# Patient Record
Sex: Female | Born: 1955
Health system: Southern US, Community
[De-identification: ages and names within clinical notes are randomized; demographics above are authoritative.]

## PROBLEM LIST (undated history)

## (undated) DIAGNOSIS — M5090 Cervical disc disorder, unspecified, unspecified cervical region: Secondary | ICD-10-CM

## (undated) DIAGNOSIS — I700 Atherosclerosis of aorta: Secondary | ICD-10-CM

## (undated) DIAGNOSIS — E669 Obesity, unspecified: Secondary | ICD-10-CM

## (undated) DIAGNOSIS — M6289 Other specified disorders of muscle: Secondary | ICD-10-CM

## (undated) DIAGNOSIS — M5136 Other intervertebral disc degeneration, lumbar region: Secondary | ICD-10-CM

## (undated) DIAGNOSIS — F419 Anxiety disorder, unspecified: Secondary | ICD-10-CM

## (undated) DIAGNOSIS — T7840XA Allergy, unspecified, initial encounter: Secondary | ICD-10-CM

## (undated) DIAGNOSIS — I2510 Atherosclerotic heart disease of native coronary artery without angina pectoris: Secondary | ICD-10-CM

## (undated) DIAGNOSIS — E785 Hyperlipidemia, unspecified: Secondary | ICD-10-CM

## (undated) DIAGNOSIS — E66811 Obesity, class 1: Secondary | ICD-10-CM

## (undated) DIAGNOSIS — K581 Irritable bowel syndrome with constipation: Secondary | ICD-10-CM

## (undated) DIAGNOSIS — T884XXA Failed or difficult intubation, initial encounter: Secondary | ICD-10-CM

## (undated) DIAGNOSIS — F32A Depression, unspecified: Secondary | ICD-10-CM

## (undated) DIAGNOSIS — Z8489 Family history of other specified conditions: Secondary | ICD-10-CM

## (undated) DIAGNOSIS — J329 Chronic sinusitis, unspecified: Secondary | ICD-10-CM

## (undated) DIAGNOSIS — M542 Cervicalgia: Secondary | ICD-10-CM

## (undated) DIAGNOSIS — M1990 Unspecified osteoarthritis, unspecified site: Secondary | ICD-10-CM

## (undated) DIAGNOSIS — Z720 Tobacco use: Secondary | ICD-10-CM

## (undated) DIAGNOSIS — F329 Major depressive disorder, single episode, unspecified: Secondary | ICD-10-CM

## (undated) DIAGNOSIS — C801 Malignant (primary) neoplasm, unspecified: Secondary | ICD-10-CM

## (undated) DIAGNOSIS — R7301 Impaired fasting glucose: Secondary | ICD-10-CM

## (undated) DIAGNOSIS — M51369 Other intervertebral disc degeneration, lumbar region without mention of lumbar back pain or lower extremity pain: Secondary | ICD-10-CM

## (undated) HISTORY — PX: OTHER SURGICAL HISTORY: SHX169

## (undated) HISTORY — DX: Other intervertebral disc degeneration, lumbar region: M51.36

## (undated) HISTORY — PX: BREAST CYST ASPIRATION: SHX578

## (undated) HISTORY — DX: Obesity, unspecified: E66.9

## (undated) HISTORY — DX: Depression, unspecified: F32.A

## (undated) HISTORY — PX: BLADDER SURGERY: SHX569

## (undated) HISTORY — DX: Tobacco use: Z72.0

## (undated) HISTORY — DX: Impaired fasting glucose: R73.01

## (undated) HISTORY — DX: Major depressive disorder, single episode, unspecified: F32.9

## (undated) HISTORY — DX: Allergy, unspecified, initial encounter: T78.40XA

## (undated) HISTORY — PX: TUBAL LIGATION: SHX77

## (undated) HISTORY — DX: Unspecified osteoarthritis, unspecified site: M19.90

## (undated) HISTORY — PX: APPENDECTOMY: SHX54

## (undated) HISTORY — PX: TONSILLECTOMY: SHX5217

## (undated) HISTORY — DX: Obesity, class 1: E66.811

## (undated) HISTORY — DX: Other intervertebral disc degeneration, lumbar region without mention of lumbar back pain or lower extremity pain: M51.369

## (undated) HISTORY — DX: Irritable bowel syndrome with constipation: K58.1

## (undated) HISTORY — DX: Anxiety disorder, unspecified: F41.9

## (undated) HISTORY — PX: HEMORRHOID SURGERY: SHX153

## (undated) HISTORY — PX: TONSILLECTOMY: SUR1361

## (undated) HISTORY — DX: Hyperlipidemia, unspecified: E78.5

## (undated) HISTORY — DX: Other specified disorders of muscle: M62.89

---

## 2005-05-20 ENCOUNTER — Ambulatory Visit: Payer: Self-pay | Admitting: Gastroenterology

## 2005-05-31 ENCOUNTER — Ambulatory Visit: Payer: Self-pay | Admitting: Gastroenterology

## 2005-06-25 ENCOUNTER — Ambulatory Visit: Payer: Self-pay | Admitting: Gastroenterology

## 2005-06-28 ENCOUNTER — Ambulatory Visit: Payer: Self-pay | Admitting: Gastroenterology

## 2006-11-14 ENCOUNTER — Ambulatory Visit: Payer: Self-pay | Admitting: Physician Assistant

## 2007-07-29 ENCOUNTER — Ambulatory Visit: Payer: Self-pay | Admitting: Obstetrics and Gynecology

## 2008-04-04 ENCOUNTER — Ambulatory Visit: Payer: Self-pay | Admitting: Pain Medicine

## 2008-04-07 ENCOUNTER — Ambulatory Visit: Payer: Self-pay | Admitting: Pain Medicine

## 2008-04-27 ENCOUNTER — Ambulatory Visit: Payer: Self-pay | Admitting: Physician Assistant

## 2008-05-05 ENCOUNTER — Ambulatory Visit: Payer: Self-pay | Admitting: Pain Medicine

## 2008-05-19 ENCOUNTER — Ambulatory Visit: Payer: Self-pay | Admitting: Pain Medicine

## 2008-06-07 ENCOUNTER — Ambulatory Visit: Payer: Self-pay | Admitting: Physician Assistant

## 2008-09-21 ENCOUNTER — Ambulatory Visit: Payer: Self-pay | Admitting: Obstetrics and Gynecology

## 2008-12-01 ENCOUNTER — Ambulatory Visit: Payer: Self-pay

## 2008-12-01 ENCOUNTER — Ambulatory Visit: Payer: Self-pay | Admitting: Pain Medicine

## 2009-01-03 ENCOUNTER — Ambulatory Visit: Payer: Self-pay | Admitting: Pain Medicine

## 2009-02-09 ENCOUNTER — Ambulatory Visit: Payer: Self-pay | Admitting: Pain Medicine

## 2009-10-05 ENCOUNTER — Ambulatory Visit: Payer: Self-pay | Admitting: Family Medicine

## 2011-01-15 ENCOUNTER — Emergency Department: Payer: Self-pay | Admitting: Emergency Medicine

## 2011-02-12 ENCOUNTER — Ambulatory Visit: Payer: Self-pay | Admitting: Family Medicine

## 2011-04-01 ENCOUNTER — Ambulatory Visit: Payer: Self-pay | Admitting: Surgery

## 2011-04-02 LAB — PATHOLOGY REPORT

## 2012-04-08 ENCOUNTER — Ambulatory Visit: Payer: Self-pay | Admitting: Family Medicine

## 2012-09-18 ENCOUNTER — Ambulatory Visit: Payer: Self-pay | Admitting: Unknown Physician Specialty

## 2012-09-21 LAB — PATHOLOGY REPORT

## 2012-11-10 ENCOUNTER — Ambulatory Visit: Payer: Self-pay | Admitting: Family Medicine

## 2012-12-05 ENCOUNTER — Ambulatory Visit: Payer: Self-pay | Admitting: Orthopedic Surgery

## 2013-05-27 ENCOUNTER — Other Ambulatory Visit: Payer: Self-pay | Admitting: Neurosurgery

## 2013-05-27 DIAGNOSIS — M5416 Radiculopathy, lumbar region: Secondary | ICD-10-CM

## 2013-05-27 DIAGNOSIS — M47812 Spondylosis without myelopathy or radiculopathy, cervical region: Secondary | ICD-10-CM

## 2013-06-28 ENCOUNTER — Ambulatory Visit
Admission: RE | Admit: 2013-06-28 | Discharge: 2013-06-28 | Disposition: A | Discharge status: Home / Self Care | Payer: Self-pay | Source: Ambulatory Visit | Attending: Neurosurgery | Admitting: Neurosurgery

## 2013-06-28 VITALS — BP 113/63 | HR 47 | Ht 63.0 in | Wt 173.0 lb

## 2013-06-28 DIAGNOSIS — M5416 Radiculopathy, lumbar region: Secondary | ICD-10-CM

## 2013-06-28 DIAGNOSIS — M47812 Spondylosis without myelopathy or radiculopathy, cervical region: Secondary | ICD-10-CM

## 2013-06-28 MED ORDER — IOHEXOL 300 MG/ML  SOLN
10.0000 mL | Freq: Once | INTRAMUSCULAR | Status: AC | PRN
  Administered 2013-06-28: 10 mL via INTRATHECAL

## 2013-06-28 MED ORDER — DIAZEPAM 5 MG PO TABS
10.0000 mg | ORAL_TABLET | Freq: Once | ORAL | Status: AC
  Administered 2013-06-28: 10 mg via ORAL

## 2013-06-29 ENCOUNTER — Ambulatory Visit: Payer: Self-pay | Admitting: Family Medicine

## 2014-07-14 DIAGNOSIS — E785 Hyperlipidemia, unspecified: Secondary | ICD-10-CM | POA: Insufficient documentation

## 2014-08-18 ENCOUNTER — Ambulatory Visit: Payer: Self-pay | Admitting: Obstetrics and Gynecology

## 2015-08-23 ENCOUNTER — Telehealth: Payer: Self-pay

## 2015-08-23 NOTE — Telephone Encounter (Signed)
Per the request of Dr. Bobetta Lime, I contacted this patient to inform her that the high dosage Flu vaccine was now available here in the clinic so her parents could come at anytime to get an injection. I also discussed her dad's Roseanne Reno) HgA1c results with her and how Dr. Nadine Counts wants her to discontinue the Glipizide 2.5mg  XL (at risk for low sugars) and to closely monitor his blood sugar at home. If it is >150 then he is to take 1 pill but otherwise stop that medication. She was told that a G.I referral was placed with [facility] in regards to his swallowing issues. I also got confirmation that the RX printed was the only ones needed.   She said ok.

## 2015-09-08 ENCOUNTER — Emergency Department: Payer: Managed Care, Other (non HMO)

## 2015-09-08 ENCOUNTER — Encounter: Payer: Self-pay | Admitting: *Deleted

## 2015-09-08 ENCOUNTER — Emergency Department
Admission: EM | Admit: 2015-09-08 | Discharge: 2015-09-09 | Disposition: A | Discharge status: Home / Self Care | Payer: Managed Care, Other (non HMO) | Attending: Emergency Medicine | Admitting: Emergency Medicine

## 2015-09-08 DIAGNOSIS — Z791 Long term (current) use of non-steroidal anti-inflammatories (NSAID): Secondary | ICD-10-CM | POA: Diagnosis not present

## 2015-09-08 DIAGNOSIS — Z720 Tobacco use: Secondary | ICD-10-CM | POA: Insufficient documentation

## 2015-09-08 DIAGNOSIS — M25562 Pain in left knee: Secondary | ICD-10-CM | POA: Diagnosis not present

## 2015-09-08 NOTE — ED Provider Notes (Signed)
[facility] Emergency Department Provider Note  ____________________________________________  Time seen: 2340  I have reviewed the triage vital signs and the nursing notes.   HISTORY  Chief Complaint Leg Pain  posterior left leg     HPI Megan Nichols is a 59 y.o. female who developed discomfort behind her left knee yesterday. She reports she lives in a "trilevel house" and she needs to walk up and down stairs firm amount. It is uncomfortable when she goes up the stairs. She denies any recent trauma, although she does report a fall and injury to the left knee back in July. She had no difficulty since then.  The patient's sister was concerned that the discomfort may be reflective of a deep vein thrombosis. The patient has presented to the hospital for evaluation of the knee. She denies any prior history of DVT. She has had no recent immobilization or other risks for a DVT.     History reviewed. No pertinent past medical history.  There are no active problems to display for this patient.   Past Surgical History  Procedure Laterality Date  . Bladder surgery    . Appendectomy    . Hemorrhoid surgery      Current Outpatient Rx  Name  Route  Sig  Dispense  Refill  . etodolac (LODINE) 200 MG capsule   Oral   Take 200 mg by mouth once.           Allergies Review of patient's allergies indicates no known allergies.  No family history on file.  Social History Social History  Substance Use Topics  . Smoking status: Current Every Day Smoker -- 1.00 packs/day for 30 years    Types: Cigarettes  . Smokeless tobacco: Never Used  . Alcohol Use: No    Review of Systems  Constitutional: Negative for fever. ENT: Negative for sore throat. Cardiovascular: Negative for chest pain. Respiratory: Negative for shortness of breath. Gastrointestinal: Negative for abdominal pain, vomiting and diarrhea. Genitourinary: Negative for  dysuria. Musculoskeletal: Pain, left knee. See history of present illness Skin: Negative for rash. Neurological: Negative for headaches   10-point ROS otherwise negative.  ____________________________________________   PHYSICAL EXAM:  VITAL SIGNS: ED Triage Vitals  Enc Vitals Group     BP 09/08/15 2054 118/77 mmHg     Pulse Rate 09/08/15 2054 73     Resp 09/08/15 2054 16     Temp 09/08/15 2054 98.3 F (36.8 C)     Temp Source 09/08/15 2054 Oral     SpO2 09/08/15 2054 94 %     Weight 09/08/15 2054 180 lb (81.647 kg)     Height 09/08/15 2054 5\' 4"  (1.626 m)     Head Cir --      Peak Flow --      Pain Score 09/08/15 2056 5     Pain Loc --      Pain Edu? --      Excl. in [place]? --     Constitutional:  Alert and oriented. Well appearing and in no distress. ENT   Head: Normocephalic and atraumatic. Cardiovascular: Normal rate, regular rhythm, no murmur noted Respiratory:  Normal respiratory effort, no tachypnea.    Breath sounds are clear and equal bilaterally.  Gastrointestinal: Soft and nontender. No distention.  Musculoskeletal: No deformity noted. Minimal tenderness behind the left knee with normal range of motion in all extremities.  No noted edema. Calves are equal in size bilaterally. Neurologic:  Normal speech and  language. No gross focal neurologic deficits are appreciated.  Skin:  Skin is warm, dry. No rash noted. No erythema or signs of cellulitis. Psychiatric: Mood and affect are normal. Speech and behavior are normal.  ____________________________________________   RADIOLOGY   IMPRESSION: No evidence of LEFT lower extremity deep venous thrombosis.  ____________________________________________   INITIAL IMPRESSION / ASSESSMENT AND PLAN / ED COURSE  Pertinent labs & imaging results that were available during my care of the patient were reviewed by me and considered in my medical decision making (see chart for details).  Pleasant 59 year old female with  concerns for a possible DVT behind her left knee. She has no noted risk factors. The ultrasound was negative for a DVT. I believe this discomfort is more likely musculoskeletal. We've discussed the possibility of a Baker's cyst. I've asked her to follow-up with her regular doctor if it does not improve.   ____________________________________________   FINAL CLINICAL IMPRESSION(S) / ED DIAGNOSES  Final diagnoses:  Pain in left knee      Ahmed Prima, MD 09/08/15 2356

## 2015-09-08 NOTE — ED Notes (Addendum)
Pt reports pain in the back of her left knee that extends down her left leg since yesterday, denies swelling, sob or cp. Denies injury

## 2015-09-08 NOTE — Discharge Instructions (Signed)
The Doppler ultrasound of your left leg was negative for a blood clot. This discomfort may be musculoskeletal. It may be a Baker's cyst. Continue light activity as tolerated. Follow-up with your doctor or with orthopedics if it does not improve. Return to the emergency department if you have swelling, redness and warmth, or other urgent concerns.

## 2015-10-07 ENCOUNTER — Other Ambulatory Visit: Payer: Self-pay | Admitting: Family Medicine

## 2015-11-17 ENCOUNTER — Encounter: Payer: Self-pay | Admitting: Family Medicine

## 2015-11-17 ENCOUNTER — Ambulatory Visit (INDEPENDENT_AMBULATORY_CARE_PROVIDER_SITE_OTHER): Payer: Managed Care, Other (non HMO) | Admitting: Family Medicine

## 2015-11-17 VITALS — BP 108/66 | HR 98 | Temp 98.5°F | Resp 16 | Wt 189.0 lb

## 2015-11-17 DIAGNOSIS — R350 Frequency of micturition: Secondary | ICD-10-CM | POA: Diagnosis not present

## 2015-11-17 DIAGNOSIS — M5090 Cervical disc disorder, unspecified, unspecified cervical region: Secondary | ICD-10-CM | POA: Insufficient documentation

## 2015-11-17 DIAGNOSIS — Z23 Encounter for immunization: Secondary | ICD-10-CM | POA: Diagnosis not present

## 2015-11-17 DIAGNOSIS — M5136 Other intervertebral disc degeneration, lumbar region: Secondary | ICD-10-CM | POA: Insufficient documentation

## 2015-11-17 DIAGNOSIS — R5383 Other fatigue: Secondary | ICD-10-CM | POA: Diagnosis not present

## 2015-11-17 DIAGNOSIS — J209 Acute bronchitis, unspecified: Secondary | ICD-10-CM | POA: Insufficient documentation

## 2015-11-17 DIAGNOSIS — Z0000 Encounter for general adult medical examination without abnormal findings: Secondary | ICD-10-CM | POA: Insufficient documentation

## 2015-11-17 DIAGNOSIS — F32A Depression, unspecified: Secondary | ICD-10-CM | POA: Insufficient documentation

## 2015-11-17 DIAGNOSIS — Z1231 Encounter for screening mammogram for malignant neoplasm of breast: Secondary | ICD-10-CM | POA: Insufficient documentation

## 2015-11-17 DIAGNOSIS — L989 Disorder of the skin and subcutaneous tissue, unspecified: Secondary | ICD-10-CM | POA: Insufficient documentation

## 2015-11-17 DIAGNOSIS — F329 Major depressive disorder, single episode, unspecified: Secondary | ICD-10-CM | POA: Insufficient documentation

## 2015-11-17 DIAGNOSIS — M1990 Unspecified osteoarthritis, unspecified site: Secondary | ICD-10-CM | POA: Insufficient documentation

## 2015-11-17 DIAGNOSIS — K581 Irritable bowel syndrome with constipation: Secondary | ICD-10-CM | POA: Insufficient documentation

## 2015-11-17 DIAGNOSIS — E785 Hyperlipidemia, unspecified: Secondary | ICD-10-CM

## 2015-11-17 DIAGNOSIS — Z789 Other specified health status: Secondary | ICD-10-CM | POA: Insufficient documentation

## 2015-11-17 DIAGNOSIS — E669 Obesity, unspecified: Secondary | ICD-10-CM | POA: Insufficient documentation

## 2015-11-17 DIAGNOSIS — N8184 Pelvic muscle wasting: Secondary | ICD-10-CM | POA: Insufficient documentation

## 2015-11-17 DIAGNOSIS — Z1389 Encounter for screening for other disorder: Secondary | ICD-10-CM | POA: Insufficient documentation

## 2015-11-17 DIAGNOSIS — F419 Anxiety disorder, unspecified: Secondary | ICD-10-CM

## 2015-11-17 LAB — POCT URINALYSIS DIPSTICK
Bilirubin, UA: NEGATIVE
Blood, UA: NEGATIVE
GLUCOSE UA: NEGATIVE
Ketones, UA: NEGATIVE
NITRITE UA: NEGATIVE
PROTEIN UA: NEGATIVE
SPEC GRAV UA: 1.015
UROBILINOGEN UA: 0.2
pH, UA: 6

## 2015-11-17 NOTE — Progress Notes (Signed)
Name: Megan Nichols   MRN: [id number]    DOB: July 02, 1956   Date:11/17/2015       Progress Note  Subjective  Chief Complaint  Chief Complaint  Patient presents with  . Annual Exam  . Referral    mammogram  . Urinary Tract Infection    HPI  Patient is here today for a Complete Female Physical Exam:  The patient has complaints of urinary frequency without dysuria. Overall feels healthy. Diet is well balanced. In general does not exercise regularly. Sees dentist regularly and addresses vision concerns with ophthalmologist if applicable. In regards to sexual activity the patient is not currently sexually active. Currently is not concerned about exposure to any STDs.   Requested Pnuemococcal 13 valent immunization due to previous hx of PNA and continues to smoke cigarettes.   Menstrual history is significant for postmenopausal.  Past Medical History  Diagnosis Date  . Allergy   . Arthritis   . Hyperlipidemia   . Obesity, Class I, BMI 30.0-34.9 (see actual BMI)   . Depression   . Anxiety   . Irritable bowel syndrome with constipation   . Degeneration of intervertebral disc of lumbar region   . Pelvic floor dysfunction     Past Surgical History  Procedure Laterality Date  . Bladder surgery    . Appendectomy    . Hemorrhoid surgery    . Tonsillectomy      Family History  Problem Relation Age of Onset  . Diabetes Father   . Heart disease Father     Social History   Social History  . Marital Status: Single    Spouse Name: N/A  . Number of Children: N/A  . Years of Education: N/A   Occupational History  . Not on file.   Social History Main Topics  . Smoking status: Current Every Day Smoker -- 1.00 packs/day for 30 years    Types: Cigarettes  . Smokeless tobacco: Never Used  . Alcohol Use: 0.0 oz/week    0 Standard drinks or equivalent per week     Comment: very rare  . Drug Use: No  . Sexual Activity: No   Other Topics Concern  . Not on file    Social History Narrative     Current outpatient prescriptions:  .  carisoprodol (SOMA) 350 MG tablet, TAKE 1 TABLET BY MOUTH TWICE A DAY AS NEEDED, Disp: 60 tablet, Rfl: 1 .  fluticasone (FLONASE) 50 MCG/ACT nasal spray, Place 2 sprays into both nostrils daily., Disp: , Rfl: 1 .  etodolac (LODINE) 500 MG tablet, Take 500 mg by mouth 2 (two) times daily as needed., Disp: , Rfl: 3  No Known Allergies  ROS  CONSTITUTIONAL: No significant weight changes, fever, chills, weakness or fatigue.  HEENT:  - Eyes: No visual changes.  - Ears: No auditory changes. No pain.  - Nose: No sneezing, congestion, runny nose. - Throat: No sore throat. No changes in swallowing. SKIN: No rash or itching.  CARDIOVASCULAR: No chest pain, chest pressure or chest discomfort. No palpitations or edema.  RESPIRATORY: No shortness of breath, cough or sputum.  GASTROINTESTINAL: No anorexia, nausea, vomiting. No changes in bowel habits. No abdominal pain or blood.  GENITOURINARY: No dysuria. Yes frequency. No discharge.  NEUROLOGICAL: No headache, dizziness, syncope, paralysis, ataxia, numbness or tingling in the extremities. No memory changes. No change in bowel or bladder control.  MUSCULOSKELETAL: Chronic joint pain. No muscle pain. HEMATOLOGIC: No anemia, bleeding or bruising.  LYMPHATICS: No enlarged  lymph nodes.  PSYCHIATRIC: No change in mood. No change in sleep pattern.  ENDOCRINOLOGIC: No reports of sweating, cold or heat intolerance. No polyuria or polydipsia.   Objective  Filed Vitals:   11/17/15 0916  BP: 108/66  Pulse: 98  Temp: 98.5 F (36.9 C)  TempSrc: Oral  Resp: 16  Weight: 189 lb (85.73 kg)  SpO2: 95%   Body mass index is 32.43 kg/(m^2).  Depression screen [facility] 2/9 11/17/2015  Decreased Interest 0  Down, Depressed, Hopeless 0  PHQ - 2 Score 0   Results for orders placed or performed in visit on 11/17/15 (from the past 24 hour(s))  POCT urinalysis dipstick     Status: Abnormal    Collection Time: 11/17/15  9:44 AM  Result Value Ref Range   Color, UA yellow    Clarity, UA clear    Glucose, UA negative    Bilirubin, UA negative    Ketones, UA negative    Spec Grav, UA 1.015    Blood, UA negative    pH, UA 6.0    Protein, UA negative    Urobilinogen, UA 0.2    Nitrite, UA negative    Leukocytes, UA small (1+) (A) Negative    Physical Exam  Constitutional: Patient appears well-developed and well-nourished. In no distress.  HEENT:  - Head: Normocephalic and atraumatic.  - Ears: Bilateral TMs gray, no erythema or effusion - Nose: Nasal mucosa moist - Mouth/Throat: Oropharynx is clear and moist. No tonsillar hypertrophy or erythema. No post nasal drainage.  - Eyes: Conjunctivae clear, EOM movements normal. PERRLA. No scleral icterus.  Neck: Normal range of motion. Neck supple. No JVD present. No thyromegaly present.  Cardiovascular: Normal rate, regular rhythm and normal heart sounds.  No murmur heard.  Pulmonary/Chest: Effort normal and breath sounds normal. No respiratory distress. Abdominal: Soft. Bowel sounds are normal, no distension. There is no tenderness. no masses BREAST: Bilateral breast exam normal with no masses, skin changes or nipple discharge FEMALE GENITALIA: Deferred RECTAL: Deferred  Musculoskeletal: Normal range of motion bilateral UE and LE, no joint effusions. Peripheral vascular: Bilateral LE no edema. Neurological: CN II-XII grossly intact with no focal deficits. Alert and oriented to person, place, and time. Coordination, balance, strength, speech and gait are normal.  Skin: Skin is warm and dry. No rash noted. No erythema.  Psychiatric: Patient has a normal mood and affect. Behavior is normal in office today. Judgment and thought content normal in office today.   Assessment & Plan  1. Annual physical exam Discussed in detail all recommended preventative measures appropriate for age and gender now and in the future.  - CBC with  Differential/Platelet - Comprehensive metabolic panel - Hemoglobin A1c - Lipid panel - TSH  2. Hyperlipidemia LDL goal <100 Recheck FLP  - CBC with Differential/Platelet - Comprehensive metabolic panel - Hemoglobin A1c - Lipid panel - TSH  3. Other fatigue Multifactorial  - CBC with Differential/Platelet - Comprehensive metabolic panel - Hemoglobin A1c - Lipid panel - TSH  4. Frequency of urination Investigate for UTI, Udip unremarkable, will culture specimen.  - POCT urinalysis dipstick  5. Need for pneumococcal vaccination Per patient request. Qualifies due to current every day cigarette smoker. Encouraged to consider quitting.   - Pneumococcal conjugate vaccine 13-valent IM  6. Encounter for screening mammogram for malignant neoplasm of breast  - MM Digital Screening; Future

## 2015-11-18 LAB — CBC WITH DIFFERENTIAL/PLATELET
BASOS: 1 %
Basophils Absolute: 0 10*3/uL (ref 0.0–0.2)
EOS (ABSOLUTE): 0.1 10*3/uL (ref 0.0–0.4)
Eos: 2 %
Hematocrit: 41.3 % (ref 34.0–46.6)
Hemoglobin: 13.8 g/dL (ref 11.1–15.9)
IMMATURE GRANULOCYTES: 0 %
Immature Grans (Abs): 0 10*3/uL (ref 0.0–0.1)
LYMPHS ABS: 2.4 10*3/uL (ref 0.7–3.1)
Lymphs: 41 %
MCH: 31.1 pg (ref 26.6–33.0)
MCHC: 33.4 g/dL (ref 31.5–35.7)
MCV: 93 fL (ref 79–97)
MONOS ABS: 0.6 10*3/uL (ref 0.1–0.9)
Monocytes: 10 %
NEUTROS PCT: 46 %
Neutrophils Absolute: 2.7 10*3/uL (ref 1.4–7.0)
PLATELETS: 280 10*3/uL (ref 150–379)
RBC: 4.44 x10E6/uL (ref 3.77–5.28)
RDW: 13.9 % (ref 12.3–15.4)
WBC: 5.8 10*3/uL (ref 3.4–10.8)

## 2015-11-18 LAB — COMPREHENSIVE METABOLIC PANEL
A/G RATIO: 2 (ref 1.1–2.5)
ALK PHOS: 66 IU/L (ref 39–117)
ALT: 12 IU/L (ref 0–32)
AST: 14 IU/L (ref 0–40)
Albumin: 4.2 g/dL (ref 3.5–5.5)
BILIRUBIN TOTAL: 0.3 mg/dL (ref 0.0–1.2)
BUN/Creatinine Ratio: 21 (ref 9–23)
BUN: 13 mg/dL (ref 6–24)
CALCIUM: 9.5 mg/dL (ref 8.7–10.2)
CHLORIDE: 102 mmol/L (ref 97–106)
CO2: 25 mmol/L (ref 18–29)
Creatinine, Ser: 0.63 mg/dL (ref 0.57–1.00)
GFR calc Af Amer: 114 mL/min/{1.73_m2} (ref >59)
GFR calc non Af Amer: 98 mL/min/{1.73_m2} (ref >59)
GLOBULIN, TOTAL: 2.1 g/dL (ref 1.5–4.5)
Glucose: 93 mg/dL (ref 65–99)
POTASSIUM: 5.1 mmol/L (ref 3.5–5.2)
SODIUM: 142 mmol/L (ref 136–144)
Total Protein: 6.3 g/dL (ref 6.0–8.5)

## 2015-11-18 LAB — LIPID PANEL
CHOLESTEROL TOTAL: 229 mg/dL — AB (ref 100–199)
Chol/HDL Ratio: 3.3 ratio units (ref 0.0–4.4)
HDL: 69 mg/dL (ref >39)
LDL Calculated: 141 mg/dL — ABNORMAL HIGH (ref 0–99)
Triglycerides: 94 mg/dL (ref 0–149)
VLDL Cholesterol Cal: 19 mg/dL (ref 5–40)

## 2015-11-18 LAB — HEMOGLOBIN A1C
ESTIMATED AVERAGE GLUCOSE: 123 mg/dL
HEMOGLOBIN A1C: 5.9 % — AB (ref 4.8–5.6)

## 2015-11-18 LAB — TSH: TSH: 2.02 u[IU]/mL (ref 0.450–4.500)

## 2015-11-21 LAB — URINE CULTURE

## 2015-11-29 ENCOUNTER — Telehealth: Payer: Self-pay | Admitting: Family Medicine

## 2015-11-29 NOTE — Telephone Encounter (Signed)
Error

## 2015-12-06 ENCOUNTER — Other Ambulatory Visit: Payer: Self-pay | Admitting: Family Medicine

## 2015-12-06 ENCOUNTER — Telehealth: Payer: Self-pay | Admitting: Family Medicine

## 2015-12-06 MED ORDER — AZITHROMYCIN 250 MG PO TABS: ORAL_TABLET | ORAL | Status: DC

## 2015-12-06 NOTE — Telephone Encounter (Signed)
Pt would like to know if something can be called in for her. She states she has bronchitis and has no time to come in. [facility] [place].

## 2015-12-06 NOTE — Telephone Encounter (Signed)
Called in a Zpak. If not improved follow up for proper evaluation.

## 2015-12-26 ENCOUNTER — Ambulatory Visit
Admission: RE | Admit: 2015-12-26 | Discharge: 2015-12-26 | Disposition: A | Discharge status: Home / Self Care | Payer: Managed Care, Other (non HMO) | Source: Ambulatory Visit | Attending: Family Medicine | Admitting: Family Medicine

## 2015-12-26 ENCOUNTER — Ambulatory Visit (INDEPENDENT_AMBULATORY_CARE_PROVIDER_SITE_OTHER): Payer: Managed Care, Other (non HMO) | Admitting: Family Medicine

## 2015-12-26 ENCOUNTER — Encounter: Payer: Self-pay | Admitting: Family Medicine

## 2015-12-26 VITALS — BP 122/76 | HR 76 | Temp 97.8°F | Resp 16 | Wt 187.6 lb

## 2015-12-26 DIAGNOSIS — J40 Bronchitis, not specified as acute or chronic: Secondary | ICD-10-CM

## 2015-12-26 DIAGNOSIS — J209 Acute bronchitis, unspecified: Secondary | ICD-10-CM

## 2015-12-26 DIAGNOSIS — Z720 Tobacco use: Secondary | ICD-10-CM

## 2015-12-26 DIAGNOSIS — Z1231 Encounter for screening mammogram for malignant neoplasm of breast: Secondary | ICD-10-CM | POA: Diagnosis not present

## 2015-12-26 DIAGNOSIS — F17210 Nicotine dependence, cigarettes, uncomplicated: Secondary | ICD-10-CM | POA: Diagnosis not present

## 2015-12-26 DIAGNOSIS — R05 Cough: Secondary | ICD-10-CM | POA: Insufficient documentation

## 2015-12-26 DIAGNOSIS — Z716 Tobacco abuse counseling: Secondary | ICD-10-CM

## 2015-12-26 HISTORY — DX: Tobacco use: Z72.0

## 2015-12-26 MED ORDER — VARENICLINE TARTRATE 1 MG PO TABS: 1.0000 mg | ORAL_TABLET | Freq: Two times a day (BID) | ORAL | Status: DC

## 2015-12-26 MED ORDER — VARENICLINE TARTRATE 0.5 MG PO TABS: ORAL_TABLET | ORAL | Status: DC

## 2015-12-26 MED ORDER — LEVOFLOXACIN 750 MG PO TABS: 750.0000 mg | ORAL_TABLET | Freq: Every day | ORAL | Status: DC

## 2015-12-26 MED ORDER — PREDNISONE 20 MG PO TABS: 20.0000 mg | ORAL_TABLET | Freq: Every day | ORAL | Status: DC

## 2015-12-26 NOTE — Patient Instructions (Signed)
Smoking Cessation, Tips for Success If you are ready to quit smoking, congratulations! You have chosen to help yourself be healthier. Cigarettes bring nicotine, tar, carbon monoxide, and other irritants into your body. Your lungs, heart, and blood vessels will be able to work better without these poisons. There are many different ways to quit smoking. Nicotine gum, nicotine patches, a nicotine inhaler, or nicotine nasal spray can help with physical craving. Hypnosis, support groups, and medicines help break the habit of smoking. WHAT THINGS CAN I DO TO MAKE QUITTING EASIER?  Here are some tips to help you quit for good:  Pick a date when you will quit smoking completely. Tell all of your friends and family about your plan to quit on that date.  Do not try to slowly cut down on the number of cigarettes you are smoking. Pick a quit date and quit smoking completely starting on that day.  Throw away all cigarettes.   Clean and remove all ashtrays from your home, work, and car.  On a card, write down your reasons for quitting. Carry the card with you and read it when you get the urge to smoke.  Cleanse your body of nicotine. Drink enough water and fluids to keep your urine clear or pale yellow. Do this after quitting to flush the nicotine from your body.  Learn to predict your moods. Do not let a bad situation be your excuse to have a cigarette. Some situations in your life might tempt you into wanting a cigarette.  Never have "just one" cigarette. It leads to wanting another and another. Remind yourself of your decision to quit.  Change habits associated with smoking. If you smoked while driving or when feeling stressed, try other activities to replace smoking. Stand up when drinking your coffee. Brush your teeth after eating. Sit in a different chair when you read the paper. Avoid alcohol while trying to quit, and try to drink fewer caffeinated beverages. Alcohol and caffeine may urge you to  smoke.  Avoid foods and drinks that can trigger a desire to smoke, such as sugary or spicy foods and alcohol.  Ask people who smoke not to smoke around you.  Have something planned to do right after eating or having a cup of coffee. For example, plan to take a walk or exercise.  Try a relaxation exercise to calm you down and decrease your stress. Remember, you may be tense and nervous for the first 2 weeks after you quit, but this will pass.  Find new activities to keep your hands busy. Play with a pen, coin, or rubber band. Doodle or draw things on paper.  Brush your teeth right after eating. This will help cut down on the craving for the taste of tobacco after meals. You can also try mouthwash.   Use oral substitutes in place of cigarettes. Try using lemon drops, carrots, cinnamon sticks, or chewing gum. Keep them handy so they are available when you have the urge to smoke.  When you have the urge to smoke, try deep breathing.  Designate your home as a nonsmoking area.  If you are a heavy smoker, ask your health care provider about a prescription for nicotine chewing gum. It can ease your withdrawal from nicotine.  Reward yourself. Set aside the cigarette money you save and buy yourself something nice.  Look for support from others. Join a support group or smoking cessation program. Ask someone at home or at work to help you with your plan   to quit smoking.  Always ask yourself, "Do I need this cigarette or is this just a reflex?" Tell yourself, "Today, I choose not to smoke," or "I do not want to smoke." You are reminding yourself of your decision to quit.  Do not replace cigarette smoking with electronic cigarettes (commonly called e-cigarettes). The safety of e-cigarettes is unknown, and some may contain harmful chemicals.  If you relapse, do not give up! Plan ahead and think about what you will do the next time you get the urge to smoke. HOW WILL I FEEL WHEN I QUIT SMOKING? You  may have symptoms of withdrawal because your body is used to nicotine (the addictive substance in cigarettes). You may crave cigarettes, be irritable, feel very hungry, cough often, get headaches, or have difficulty concentrating. The withdrawal symptoms are only temporary. They are strongest when you first quit but will go away within 10-14 days. When withdrawal symptoms occur, stay in control. Think about your reasons for quitting. Remind yourself that these are signs that your body is healing and getting used to being without cigarettes. Remember that withdrawal symptoms are easier to treat than the major diseases that smoking can cause.  Even after the withdrawal is over, expect periodic urges to smoke. However, these cravings are generally short lived and will go away whether you smoke or not. Do not smoke! WHAT RESOURCES ARE AVAILABLE TO HELP ME QUIT SMOKING? Your health care provider can direct you to community resources or hospitals for support, which may include:  Group support.  Education.  Hypnosis.  Therapy.   This information is not intended to replace advice given to you by your health care provider. Make sure you discuss any questions you have with your health care provider.   Document Released: 09/13/2004 Document Revised: 01/06/2015 Document Reviewed: 06/03/2013 Elsevier Interactive Patient Education 2016 [employer]  

## 2015-12-26 NOTE — Progress Notes (Signed)
Name: Megan Nichols   MRN: [id number]    DOB: August 12, 1956   Date:12/26/2015       Progress Note  Subjective  Chief Complaint  Chief Complaint  Patient presents with  . Bronchitis  . Nicotine Dependence    wants to try chantix    HPI  Patient called 12/06/15 reporting bronchitis type symptoms. She did not have time to follow in clinic so a courtesy RX of Zpak was sent to her pharmacy but she is here today as her symptoms persist. She is here today with concerns regarding the following symptoms congestion, post nasal drip, ear pressure and productive cough that started weeks ago.  Associated with fatigue. Not associated with rash, nausea, vomiting, chills, diarrhea. Has tried the following remedies: Azithromycin and her mother's albuterol inhaler.   Her mother had severe COPD and recently passed away from another cause, she is coping well with this as much as possible. She would like help with her own smoking habits and is ready to try and quite. When she is off work she is up to 1 pack per day. Has not tried other smoking cessation methods but would like to try Chantix.   Past Medical History  Diagnosis Date  . Allergy   . Arthritis   . Hyperlipidemia   . Obesity, Class I, BMI 30.0-34.9 (see actual BMI)   . Depression   . Anxiety   . Irritable bowel syndrome with constipation   . Degeneration of intervertebral disc of lumbar region   . Pelvic floor dysfunction     Social History  Substance Use Topics  . Smoking status: Current Every Day Smoker -- 1.00 packs/day for 30 years    Types: Cigarettes  . Smokeless tobacco: Never Used  . Alcohol Use: 0.0 oz/week    0 Standard drinks or equivalent per week     Comment: very rare     Current outpatient prescriptions:  .  carisoprodol (SOMA) 350 MG tablet, TAKE 1 TABLET BY MOUTH TWICE A DAY AS NEEDED, Disp: 60 tablet, Rfl: 1 .  etodolac (LODINE) 500 MG tablet, Take 500 mg by mouth 2 (two) times daily as needed., Disp: , Rfl: 3 .   fluticasone (FLONASE) 50 MCG/ACT nasal spray, Place 2 sprays into both nostrils daily. Reported on 12/26/2015, Disp: , Rfl: 1  No Known Allergies  ROS  Positive for fatigue, nasal congestion, sinus pressure, ear fullness, cough as mentioned in HPI, otherwise all systems reviewed and are negative.  Objective  Filed Vitals:   12/26/15 1007  BP: 122/76  Pulse: 76  Temp: 97.8 F (36.6 C)  TempSrc: Oral  Resp: 16  Weight: 187 lb 9.6 oz (85.095 kg)  SpO2: 96%   Body mass index is 32.19 kg/(m^2).   Physical Exam  Constitutional: Patient appears well-developed and well-nourished. In no acute distress but does appear to be fatigued from acute illness. HEENT:  - Head: Normocephalic and atraumatic.  - Ears: RIGHT TM bulging with minimal clear exudate, LEFT TM bulging with minimal clear exudate.  - Nose: Nasal mucosa boggy and congested.  - Mouth/Throat: Oropharynx is moist with slight erythema of bilateral tonsils without hypertrophy or exudates. Post nasal drainage present.  - Eyes: Conjunctivae clear, EOM movements normal. PERRLA. No scleral icterus.  Neck: Normal range of motion. Neck supple. No JVD present. No thyromegaly present. No local lymphadenopathy. Cardiovascular: Regular rate, regular rhythm with no murmurs heard.  Pulmonary/Chest: Effort normal and breath sounds scattered rhonchi at mid to lower lung  fields.  Musculoskeletal: Normal range of motion bilateral UE and LE, no joint effusions. Skin: Skin is warm and dry. No rash noted. Psychiatric: Patient has a normal mood and affect. Behavior is normal in office today. Judgment and thought content normal in office today.   Assessment & Plan  1. Bronchitis with bronchospasm Etiologies include initial allergic rhinitis or viral infection progressing to superimposed bacterial infection. Instructed patient on increasing hydration, nasal saline spray, steam inhalation, NSAID if tolerated and not contraindicated. Delsym or  Mucinex OTC for cough relief. Will get CXR due to prolonged symptoms, rule out Pneumonia. Changed RX to high dose Levaquin.   - DG Chest 2 View; Future - levofloxacin (LEVAQUIN) 750 MG tablet; Take 1 tablet (750 mg total) by mouth daily.  Dispense: 10 tablet; Refill: 0 - predniSONE (DELTASONE) 20 MG tablet; Take 1 tablet (20 mg total) by mouth daily with breakfast.  Dispense: 5 tablet; Refill: 0  2. Encounter for smoking cessation counseling The patient has been counseled on smoking cessation benefits, goals, strategies and available over the counter and prescription medications that may help them in their efforts.  Options discussed include Nicoderm patches, Wellbutrin and Chantix.  The patient voices understanding their increased risk of cardiovascular and pulmonary diseases with continued use of tobacco products and is ready to try Chantix.  The patient has been counseled on the proper use, side effects and potential interactions of the new medication. Patient encouraged to review the side effects and safety profile pamphlet provided with the prescription from the pharmacy as well as request counseling from the pharmacy team as needed.    - varenicline (CHANTIX) 0.5 MG tablet; Start with 0.5 mg po qd x3 days, then 0.5 mg po bid x 4 days  Dispense: 11 tablet; Refill: 0 - varenicline (CHANTIX CONTINUING MONTH PAK) 1 MG tablet; Take 1 tablet (1 mg total) by mouth 2 (two) times daily.  Dispense: 60 tablet; Refill: 1  3. Moderate cigarette smoker (10-19 per day)  - varenicline (CHANTIX) 0.5 MG tablet; Start with 0.5 mg po qd x3 days, then 0.5 mg po bid x 4 days  Dispense: 11 tablet; Refill: 0 - varenicline (CHANTIX CONTINUING MONTH PAK) 1 MG tablet; Take 1 tablet (1 mg total) by mouth 2 (two) times daily.  Dispense: 60 tablet; Refill: 1

## 2015-12-27 ENCOUNTER — Ambulatory Visit: Payer: Managed Care, Other (non HMO) | Admitting: Family Medicine

## 2016-05-21 ENCOUNTER — Ambulatory Visit (INDEPENDENT_AMBULATORY_CARE_PROVIDER_SITE_OTHER): Payer: Managed Care, Other (non HMO) | Admitting: Family Medicine

## 2016-05-21 ENCOUNTER — Encounter: Payer: Self-pay | Admitting: Family Medicine

## 2016-05-21 VITALS — BP 120/72 | HR 88 | Temp 98.8°F | Resp 16 | Wt 190.0 lb

## 2016-05-21 DIAGNOSIS — R7301 Impaired fasting glucose: Secondary | ICD-10-CM | POA: Diagnosis not present

## 2016-05-21 DIAGNOSIS — Z720 Tobacco use: Secondary | ICD-10-CM

## 2016-05-21 DIAGNOSIS — M5090 Cervical disc disorder, unspecified, unspecified cervical region: Secondary | ICD-10-CM

## 2016-05-21 DIAGNOSIS — Z5181 Encounter for therapeutic drug level monitoring: Secondary | ICD-10-CM | POA: Diagnosis not present

## 2016-05-21 DIAGNOSIS — E669 Obesity, unspecified: Secondary | ICD-10-CM | POA: Diagnosis not present

## 2016-05-21 DIAGNOSIS — M5136 Other intervertebral disc degeneration, lumbar region: Secondary | ICD-10-CM

## 2016-05-21 DIAGNOSIS — E785 Hyperlipidemia, unspecified: Secondary | ICD-10-CM | POA: Diagnosis not present

## 2016-05-21 DIAGNOSIS — M1990 Unspecified osteoarthritis, unspecified site: Secondary | ICD-10-CM | POA: Diagnosis not present

## 2016-05-21 DIAGNOSIS — M51369 Other intervertebral disc degeneration, lumbar region without mention of lumbar back pain or lower extremity pain: Secondary | ICD-10-CM

## 2016-05-21 HISTORY — DX: Impaired fasting glucose: R73.01

## 2016-05-21 MED ORDER — ETODOLAC 500 MG PO TABS: 500.0000 mg | ORAL_TABLET | Freq: Every day | ORAL | Status: DC

## 2016-05-21 MED ORDER — CARISOPRODOL 350 MG PO TABS: 175.0000 mg | ORAL_TABLET | Freq: Every day | ORAL | Status: DC | PRN

## 2016-05-21 NOTE — Progress Notes (Signed)
BP 120/72 mmHg  Pulse 88  Temp(Src) 98.8 F (37.1 C) (Oral)  Resp 16  Wt 190 lb (86.183 kg)  SpO2 96%   Subjective:    Patient ID: Megan Nichols, female    DOB: April 10, 1956, 60 y.o.   MRN: [id number]  HPI: Megan Nichols is a 60 y.o. female  Chief Complaint  Patient presents with  . Medication Refill  . Referral    for chronic back pain   Patient is new to me; her previous provider here left this practice  She has had DDD for about 10 years Used to stand all day, now sits all day for the most part; when she does get up, it takes forever to adjust from seated position; taking the first step is almost breath-taking Compression from sitting is bothersome; laying down flat is fine Bending is okay; trying to exercise and do planks, wellness stuff at work It's in her neck too Last imaging was an MRI/MRA aabout 3-4 years ago Neck is a little bit better; that's where the spasms are; she has tried flexeril but it doesn't do the same thing; she cannot take benadryl because it puts her down; lethargic with flexeril; the soma doesn't do that; the soma plus lodine doesn't put her down; not really taking anything though for last 6 months Going to the chiropractor, but it gets expensive  Nov 2007, MR lumbar spine Dec 2009, MR C spine  Dec 2013, MR lumbar spine June 2014, myelogram  Review of her chart shows that she has impaired fasting glucose Lab Results  Component Value Date   HGBA1C 5.9* 11/17/2015   She also has high cholesterol; last labs were Nov 2016; she does not take a statin Lab Results  Component Value Date   CHOL 229* 11/17/2015   Lab Results  Component Value Date   HDL 69 11/17/2015   Lab Results  Component Value Date   LDLCALC 141* 11/17/2015   Lab Results  Component Value Date   TRIG 94 11/17/2015   Lab Results  Component Value Date   CHOLHDL 3.3 11/17/2015   No results found for: LDLDIRECT   Depression screen [facility] 2/9 05/21/2016 11/17/2015   Decreased Interest 0 0  Down, Depressed, Hopeless 3 0  PHQ - 2 Score 3 0  Altered sleeping 1 -  Tired, decreased energy 0 -  Change in appetite 1 -  Feeling bad or failure about yourself  0 -  Trouble concentrating 0 -  Moving slowly or fidgety/restless 0 -  Suicidal thoughts 0 -  PHQ-9 Score 5 -  Difficult doing work/chores Somewhat difficult -   Relevant past medical, surgical, family and social history reviewed Past Medical History  Diagnosis Date  . Allergy   . Arthritis   . Hyperlipidemia   . Obesity, Class I, BMI 30.0-34.9 (see actual BMI)   . Depression   . Anxiety   . Irritable bowel syndrome with constipation   . Degeneration of intervertebral disc of lumbar region   . Pelvic floor dysfunction   . Impaired fasting glucose 05/21/2016  . Tobacco use 12/26/2015   Past Surgical History  Procedure Laterality Date  . Bladder surgery    . Appendectomy    . Hemorrhoid surgery    . Tonsillectomy    . Breast cyst aspiration Left     1999   Family History  Problem Relation Age of Onset  . Diabetes Father   . Heart disease Father   . Hypothyroidism  Father   . Hypertension Father   . Leukemia Father   . Breast cancer Neg Hx   . Intracerebral hemorrhage Mother   . AAA (abdominal aortic aneurysm) Mother   . Stroke Mother   . Hypothyroidism Sister   . Hypertension Sister   MD notes: Mother died 2015/04/05, age 23  Social History  Substance Use Topics  . Smoking status: Current Every Day Smoker -- 1.00 packs/day for 30 years    Types: Cigarettes  . Smokeless tobacco: Never Used  . Alcohol Use: 0.0 oz/week    0 Standard drinks or equivalent per week     Comment: very rare  MD notes: did not tolerate Chantix Rare EtOH, never with soma  Interim medical history since last visit reviewed. Allergies and medications reviewed  Review of Systems Per HPI unless specifically indicated above Trouble losing weight; exercising and not budging    Objective:    BP 120/72  mmHg  Pulse 88  Temp(Src) 98.8 F (37.1 C) (Oral)  Resp 16  Wt 190 lb (86.183 kg)  SpO2 96%  Wt Readings from Last 3 Encounters:  05/21/16 190 lb (86.183 kg)  12/26/15 187 lb 9.6 oz (85.095 kg)  11/17/15 189 lb (85.73 kg)   Body mass index is 32.6 kg/(m^2).  Physical Exam  Constitutional: She appears well-developed and well-nourished. No distress.  HENT:  Head: Normocephalic and atraumatic.  Eyes: EOM are normal. No scleral icterus.  Neck: No thyromegaly present.  Cardiovascular: Normal rate, regular rhythm and normal heart sounds.   No murmur heard. Pulmonary/Chest: Effort normal and breath sounds normal. No respiratory distress. She has no wheezes.  Abdominal: Soft. Bowel sounds are normal. She exhibits no distension.  Musculoskeletal: Normal range of motion. She exhibits no edema.  Neurological: She is alert. She exhibits normal muscle tone.  Skin: Skin is warm and dry. She is not diaphoretic. No pallor.  Psychiatric: She has a normal mood and affect. Her behavior is normal. Judgment and thought content normal.   Results for orders placed or performed in visit on 11/17/15  Urine Culture  Result Value Ref Range   Urine Culture, Routine Final report    Urine Culture result 1 Comment   CBC with Differential/Platelet  Result Value Ref Range   WBC 5.8 3.4 - 10.8 x10E3/uL   RBC 4.44 3.77 - 5.28 x10E6/uL   Hemoglobin 13.8 11.1 - 15.9 g/dL   Hematocrit 41.3 34.0 - 46.6 %   MCV 93 79 - 97 fL   MCH 31.1 26.6 - 33.0 pg   MCHC 33.4 31.5 - 35.7 g/dL   RDW 13.9 12.3 - 15.4 %   Platelets 280 150 - 379 x10E3/uL   Neutrophils 46 %   Lymphs 41 %   Monocytes 10 %   Eos 2 %   Basos 1 %   Neutrophils Absolute 2.7 1.4 - 7.0 x10E3/uL   Lymphocytes Absolute 2.4 0.7 - 3.1 x10E3/uL   Monocytes Absolute 0.6 0.1 - 0.9 x10E3/uL   EOS (ABSOLUTE) 0.1 0.0 - 0.4 x10E3/uL   Basophils Absolute 0.0 0.0 - 0.2 x10E3/uL   Immature Granulocytes 0 %   Immature Grans (Abs) 0.0 0.0 - 0.1 x10E3/uL    Comprehensive metabolic panel  Result Value Ref Range   Glucose 93 65 - 99 mg/dL   BUN 13 6 - 24 mg/dL   Creatinine, Ser 0.63 0.57 - 1.00 mg/dL   GFR calc non Af Amer 98 >59 mL/min/1.73   GFR calc Af Amer 114 >59  mL/min/1.73   BUN/Creatinine Ratio 21 9 - 23   Sodium 142 136 - 144 mmol/L   Potassium 5.1 3.5 - 5.2 mmol/L   Chloride 102 97 - 106 mmol/L   CO2 25 18 - 29 mmol/L   Calcium 9.5 8.7 - 10.2 mg/dL   Total Protein 6.3 6.0 - 8.5 g/dL   Albumin 4.2 3.5 - 5.5 g/dL   Globulin, Total 2.1 1.5 - 4.5 g/dL   Albumin/Globulin Ratio 2.0 1.1 - 2.5   Bilirubin Total 0.3 0.0 - 1.2 mg/dL   Alkaline Phosphatase 66 39 - 117 IU/L   AST 14 0 - 40 IU/L   ALT 12 0 - 32 IU/L  Hemoglobin A1c  Result Value Ref Range   Hgb A1c MFr Bld 5.9 (H) 4.8 - 5.6 %   Est. average glucose Bld gHb Est-mCnc 123 mg/dL  Lipid panel  Result Value Ref Range   Cholesterol, Total 229 (H) 100 - 199 mg/dL   Triglycerides 94 0 - 149 mg/dL   HDL 69 >39 mg/dL   VLDL Cholesterol Cal 19 5 - 40 mg/dL   LDL Calculated 141 (H) 0 - 99 mg/dL   Chol/HDL Ratio 3.3 0.0 - 4.4 ratio units  TSH  Result Value Ref Range   TSH 2.020 0.450 - 4.500 uIU/mL  POCT urinalysis dipstick  Result Value Ref Range   Color, UA yellow    Clarity, UA clear    Glucose, UA negative    Bilirubin, UA negative    Ketones, UA negative    Spec Grav, UA 1.015    Blood, UA negative    pH, UA 6.0    Protein, UA negative    Urobilinogen, UA 0.2    Nitrite, UA negative    Leukocytes, UA small (1+) (A) Negative      Assessment & Plan:   Problem List Items Addressed This Visit      Endocrine   Impaired fasting glucose - Primary    Check A1c and fasting glucose      Relevant Orders   Hemoglobin A1c     Musculoskeletal and Integument   Arthritis    back      Relevant Medications   etodolac (LODINE) 500 MG tablet   carisoprodol (SOMA) 350 MG tablet   Cervical disc disease    Refer to orthopaedic spine specialist      Relevant  Orders   Ambulatory referral to Orthopedic Surgery   Degeneration of intervertebral disc of lumbar region    Refer to back doctor; I do not plan to manage long term back pain; she can see back doctor and/or go to pain clinic; never ever mix soma with alcohol; she came to me on soma and this is not a drug I traditionally prescribe      Relevant Medications   etodolac (LODINE) 500 MG tablet   carisoprodol (SOMA) 350 MG tablet   Other Relevant Orders   Ambulatory referral to Orthopedic Surgery     Other   Hyperlipidemia LDL goal <100    Check lipids, limit saturated fats      Relevant Orders   Lipid Panel w/o Chol/HDL Ratio   Obesity    Encouraged weight loss, which would likely help her lipids and A1c; see AVS; check TSH to r/o hypothyroisism      Relevant Orders   TSH   Tobacco use    Encouraged pt to quit smoking; see AVS       Other Visit Diagnoses  Medication monitoring encounter        Relevant Orders    CBC with Differential/Platelet    Comprehensive metabolic panel       Follow up plan: No Follow-up on file. --> base don labs; if A1c 6.5 or higher, will bring her back in for new diabetes discussion; if lipids and A1c controlled, will see her back in 6 months  An after-visit summary was printed and given to the patient at [place].  Please see the patient instructions which may contain other information and recommendations beyond what is mentioned above in the assessment and plan.  Meds ordered this encounter  Medications  . etodolac (LODINE) 500 MG tablet    Sig: Take 1 tablet (500 mg total) by mouth daily.    Dispense:  30 tablet    Refill:  1  . carisoprodol (SOMA) 350 MG tablet    Sig: Take 0.5 tablets (175 mg total) by mouth daily as needed.    Dispense:  15 tablet    Refill:  1    Orders Placed This Encounter  Procedures  . Hemoglobin A1c  . CBC with Differential/Platelet  . Comprehensive metabolic panel  . Lipid Panel w/o Chol/HDL Ratio  . TSH   . Ambulatory referral to Orthopedic Surgery

## 2016-05-21 NOTE — Patient Instructions (Addendum)
Check out the information at familydoctor.org entitled "Nutrition for Weight Loss: What You Need to Know about Fad Diets" Try to lose between 1-2 pounds per week by taking in fewer calories and burning off more calories You can succeed by limiting portions, limiting foods dense in calories and fat, becoming more active, and drinking 8 glasses of water a day (64 ounces) Don't skip meals, especially breakfast, as skipping meals may alter your metabolism Do not use over-the-counter weight loss pills or gimmicks that claim rapid weight loss A healthy BMI (or body mass index) is between 18.5 and 24.9 You can calculate your ideal BMI at the [place] website [employer]fr  We'll have you see the back doctor  Have fasting labs done soon at your convenience  Try to limit saturated fats in your diet (bologna, hot dogs, barbeque, cheeseburgers, hamburgers, steak, bacon, sausage, cheese, etc.) and get more fresh fruits, vegetables, and whole grains  I do encourage you to quit smoking Call 9370888158 to sign up for smoking cessation classes You can call 1-800-QUIT-NOW to talk with a smoking cessation coach  Smoking Cessation, Tips for Success If you are ready to quit smoking, congratulations! You have chosen to help yourself be healthier. Cigarettes bring nicotine, tar, carbon monoxide, and other irritants into your body. Your lungs, heart, and blood vessels will be able to work better without these poisons. There are many different ways to quit smoking. Nicotine gum, nicotine patches, a nicotine inhaler, or nicotine nasal spray can help with physical craving. Hypnosis, support groups, and medicines help break the habit of smoking. WHAT THINGS CAN I DO TO MAKE QUITTING EASIER?  Here are some tips to help you quit for good:  Pick a date when you will quit smoking completely. Tell all of your friends and family about your plan to quit on that date.  Do not  try to slowly cut down on the number of cigarettes you are smoking. Pick a quit date and quit smoking completely starting on that day.  Throw away all cigarettes.   Clean and remove all ashtrays from your home, work, and car.  On a card, write down your reasons for quitting. Carry the card with you and read it when you get the urge to smoke.  Cleanse your body of nicotine. Drink enough water and fluids to keep your urine clear or pale yellow. Do this after quitting to flush the nicotine from your body.  Learn to predict your moods. Do not let a bad situation be your excuse to have a cigarette. Some situations in your life might tempt you into wanting a cigarette.  Never have "just one" cigarette. It leads to wanting another and another. Remind yourself of your decision to quit.  Change habits associated with smoking. If you smoked while driving or when feeling stressed, try other activities to replace smoking. Stand up when drinking your coffee. Brush your teeth after eating. Sit in a different chair when you read the paper. Avoid alcohol while trying to quit, and try to drink fewer caffeinated beverages. Alcohol and caffeine may urge you to smoke.  Avoid foods and drinks that can trigger a desire to smoke, such as sugary or spicy foods and alcohol.  Ask people who smoke not to smoke around you.  Have something planned to do right after eating or having a cup of coffee. For example, plan to take a walk or exercise.  Try a relaxation exercise to calm you down and decrease your stress. Remember, you  may be tense and nervous for the first 2 weeks after you quit, but this will pass.  Find new activities to keep your hands busy. Play with a pen, coin, or rubber band. Doodle or draw things on paper.  Brush your teeth right after eating. This will help cut down on the craving for the taste of tobacco after meals. You can also try mouthwash.   Use oral substitutes in place of cigarettes. Try  using lemon drops, carrots, cinnamon sticks, or chewing gum. Keep them handy so they are available when you have the urge to smoke.  When you have the urge to smoke, try deep breathing.  Designate your home as a nonsmoking area.  If you are a heavy smoker, ask your health care provider about a prescription for nicotine chewing gum. It can ease your withdrawal from nicotine.  Reward yourself. Set aside the cigarette money you save and buy yourself something nice.  Look for support from others. Join a support group or smoking cessation program. Ask someone at home or at work to help you with your plan to quit smoking.  Always ask yourself, "Do I need this cigarette or is this just a reflex?" Tell yourself, "Today, I choose not to smoke," or "I do not want to smoke." You are reminding yourself of your decision to quit.  Do not replace cigarette smoking with electronic cigarettes (commonly called e-cigarettes). The safety of e-cigarettes is unknown, and some may contain harmful chemicals.  If you relapse, do not give up! Plan ahead and think about what you will do the next time you get the urge to smoke. HOW WILL I FEEL WHEN I QUIT SMOKING? You may have symptoms of withdrawal because your body is used to nicotine (the addictive substance in cigarettes). You may crave cigarettes, be irritable, feel very hungry, cough often, get headaches, or have difficulty concentrating. The withdrawal symptoms are only temporary. They are strongest when you first quit but will go away within 10-14 days. When withdrawal symptoms occur, stay in control. Think about your reasons for quitting. Remind yourself that these are signs that your body is healing and getting used to being without cigarettes. Remember that withdrawal symptoms are easier to treat than the major diseases that smoking can cause.  Even after the withdrawal is over, expect periodic urges to smoke. However, these cravings are generally short lived and  will go away whether you smoke or not. Do not smoke! WHAT RESOURCES ARE AVAILABLE TO HELP ME QUIT SMOKING? Your health care provider can direct you to community resources or hospitals for support, which may include:  Group support.  Education.  Hypnosis.  Therapy.   This information is not intended to replace advice given to you by your health care provider. Make sure you discuss any questions you have with your health care provider.   Document Released: 09/13/2004 Document Revised: 01/06/2015 Document Reviewed: 06/03/2013 Elsevier Interactive Patient Education [employer].

## 2016-05-21 NOTE — Assessment & Plan Note (Signed)
Refer to orthopaedic spine specialist

## 2016-05-21 NOTE — Assessment & Plan Note (Signed)
Check lipids, limit saturated fats 

## 2016-05-21 NOTE — Assessment & Plan Note (Addendum)
Refer to back doctor; I do not plan to manage long term back pain; she can see back doctor and/or go to pain clinic; never ever mix soma with alcohol; she came to me on soma and this is not a drug I traditionally prescribe

## 2016-05-21 NOTE — Assessment & Plan Note (Signed)
back

## 2016-05-21 NOTE — Assessment & Plan Note (Signed)
Check A1c and fasting glucose

## 2016-06-15 ENCOUNTER — Encounter: Payer: Self-pay | Admitting: Family Medicine

## 2016-06-15 NOTE — Assessment & Plan Note (Signed)
Encouraged weight loss, which would likely help her lipids and A1c; see AVS; check TSH to r/o hypothyroisism

## 2016-06-15 NOTE — Assessment & Plan Note (Signed)
Encouraged pt to quit smoking; see AVS

## 2016-06-22 ENCOUNTER — Encounter: Payer: Self-pay | Admitting: Family Medicine

## 2016-07-04 LAB — COMPREHENSIVE METABOLIC PANEL
ALT: 11 IU/L (ref 0–32)
AST: 14 IU/L (ref 0–40)
Albumin/Globulin Ratio: 2 (ref 1.2–2.2)
Albumin: 4.2 g/dL (ref 3.6–4.8)
Alkaline Phosphatase: 67 IU/L (ref 39–117)
BILIRUBIN TOTAL: 0.3 mg/dL (ref 0.0–1.2)
BUN/Creatinine Ratio: 24 (ref 12–28)
BUN: 15 mg/dL (ref 8–27)
CHLORIDE: 103 mmol/L (ref 96–106)
CO2: 23 mmol/L (ref 18–29)
CREATININE: 0.63 mg/dL (ref 0.57–1.00)
Calcium: 9.2 mg/dL (ref 8.7–10.3)
GFR calc non Af Amer: 98 mL/min/{1.73_m2} (ref >59)
GFR, EST AFRICAN AMERICAN: 113 mL/min/{1.73_m2} (ref >59)
GLUCOSE: 91 mg/dL (ref 65–99)
Globulin, Total: 2.1 g/dL (ref 1.5–4.5)
Potassium: 4.4 mmol/L (ref 3.5–5.2)
Sodium: 140 mmol/L (ref 134–144)
TOTAL PROTEIN: 6.3 g/dL (ref 6.0–8.5)

## 2016-07-04 LAB — LIPID PANEL W/O CHOL/HDL RATIO
CHOLESTEROL TOTAL: 232 mg/dL — AB (ref 100–199)
HDL: 76 mg/dL (ref >39)
LDL Calculated: 141 mg/dL — ABNORMAL HIGH (ref 0–99)
Triglycerides: 73 mg/dL (ref 0–149)
VLDL CHOLESTEROL CAL: 15 mg/dL (ref 5–40)

## 2016-07-04 LAB — CBC WITH DIFFERENTIAL/PLATELET
BASOS: 1 %
Basophils Absolute: 0 10*3/uL (ref 0.0–0.2)
EOS (ABSOLUTE): 0.1 10*3/uL (ref 0.0–0.4)
Eos: 2 %
HEMOGLOBIN: 14.9 g/dL (ref 11.1–15.9)
Hematocrit: 43.3 % (ref 34.0–46.6)
IMMATURE GRANS (ABS): 0 10*3/uL (ref 0.0–0.1)
Immature Granulocytes: 0 %
LYMPHS: 35 %
Lymphocytes Absolute: 2 10*3/uL (ref 0.7–3.1)
MCH: 31.3 pg (ref 26.6–33.0)
MCHC: 34.4 g/dL (ref 31.5–35.7)
MCV: 91 fL (ref 79–97)
Monocytes Absolute: 0.7 10*3/uL (ref 0.1–0.9)
Monocytes: 11 %
NEUTROS ABS: 3 10*3/uL (ref 1.4–7.0)
Neutrophils: 51 %
PLATELETS: 299 10*3/uL (ref 150–379)
RBC: 4.76 x10E6/uL (ref 3.77–5.28)
RDW: 14.7 % (ref 12.3–15.4)
WBC: 5.8 10*3/uL (ref 3.4–10.8)

## 2016-07-04 LAB — HEMOGLOBIN A1C
Est. average glucose Bld gHb Est-mCnc: 114 mg/dL
HEMOGLOBIN A1C: 5.6 % (ref 4.8–5.6)

## 2016-07-04 LAB — TSH: TSH: 1.76 u[IU]/mL (ref 0.450–4.500)

## 2016-12-10 ENCOUNTER — Encounter: Payer: Managed Care, Other (non HMO) | Admitting: Family Medicine

## 2017-02-28 ENCOUNTER — Other Ambulatory Visit: Payer: Self-pay | Admitting: Obstetrics and Gynecology

## 2017-02-28 DIAGNOSIS — Z1231 Encounter for screening mammogram for malignant neoplasm of breast: Secondary | ICD-10-CM

## 2017-03-28 ENCOUNTER — Ambulatory Visit
Admission: RE | Admit: 2017-03-28 | Discharge: 2017-03-28 | Disposition: A | Discharge status: Home / Self Care | Payer: Commercial Managed Care - PPO | Source: Ambulatory Visit | Attending: Obstetrics and Gynecology | Admitting: Obstetrics and Gynecology

## 2017-03-28 DIAGNOSIS — Z1231 Encounter for screening mammogram for malignant neoplasm of breast: Secondary | ICD-10-CM | POA: Diagnosis not present

## 2017-07-01 ENCOUNTER — Telehealth: Payer: Self-pay | Admitting: Family Medicine

## 2017-07-01 NOTE — Telephone Encounter (Signed)
Left detailed voicemail

## 2017-07-01 NOTE — Telephone Encounter (Signed)
Patient has not been seen in over a year; please schedule appt asap, with NP if nothing available or to urgent care or behavioral health NOW if any thoughts of hurting self or others; call 911 if suicidal

## 2017-07-01 NOTE — Telephone Encounter (Signed)
PT IS ASKING TO GO BACK ON LEXIPRO. SHE IS HAVING A LOT OF THINGS GOING ON AND IS CRYING A LOT. SHE SAYS THAT SHE KNOWS THAT SHE HAS NOT BEEN ON IT IN AWHILE. ADVISED THE PATIENT THAT SHE MAY HAVE TO HAVE AN APPT.

## 2017-07-09 ENCOUNTER — Ambulatory Visit (INDEPENDENT_AMBULATORY_CARE_PROVIDER_SITE_OTHER): Payer: Commercial Managed Care - PPO | Admitting: Family Medicine

## 2017-07-09 ENCOUNTER — Encounter: Payer: Self-pay | Admitting: Family Medicine

## 2017-07-09 VITALS — BP 122/68 | HR 74 | Temp 97.9°F | Resp 16 | Wt 183.4 lb

## 2017-07-09 DIAGNOSIS — F329 Major depressive disorder, single episode, unspecified: Secondary | ICD-10-CM

## 2017-07-09 DIAGNOSIS — Z13820 Encounter for screening for osteoporosis: Secondary | ICD-10-CM

## 2017-07-09 DIAGNOSIS — E6609 Other obesity due to excess calories: Secondary | ICD-10-CM

## 2017-07-09 DIAGNOSIS — Z6831 Body mass index (BMI) 31.0-31.9, adult: Secondary | ICD-10-CM

## 2017-07-09 DIAGNOSIS — M5136 Other intervertebral disc degeneration, lumbar region: Secondary | ICD-10-CM | POA: Diagnosis not present

## 2017-07-09 DIAGNOSIS — F419 Anxiety disorder, unspecified: Secondary | ICD-10-CM

## 2017-07-09 MED ORDER — PREDNISONE 10 MG (21) PO TBPK: ORAL_TABLET | ORAL | 0 refills | Status: AC

## 2017-07-09 MED ORDER — DULOXETINE HCL 30 MG PO CPEP: ORAL_CAPSULE | ORAL | 0 refills | Status: DC

## 2017-07-09 NOTE — Patient Instructions (Addendum)
Start the prednisone Do not take any NSAIDs while on prednisone When done with prednisone, I suggest naproxen 220 mg every twelve hours (take with food) if needed for pain  Try turmeric as a natural anti-inflammatory (for pain and arthritis). It comes in capsules where you buy aspirin and fish oil, but also as a spice where you buy pepper and garlic powder.  Start the Cymbalta and call me before your next visit with any problems If you have dark thoughts, seek help immediately

## 2017-07-09 NOTE — Progress Notes (Signed)
BP 122/68   Pulse 74   Temp 97.9 F (36.6 C) (Oral)   Resp 16   Wt 183 lb 6.4 oz (83.2 kg)   SpO2 97%   BMI 31.48 kg/m    Subjective:    Patient ID: Megan Nichols, female    DOB: 1956-10-05, 61 y.o.   MRN: [id number]  HPI: Megan Nichols is a 61 y.o. female  Chief Complaint  Patient presents with  . Medication Refill    wants to be placed back on lexapro  . Leg Pain    left includes, hip, knee and ankle  . Sinusitis    HPI She wants to go back on lexapro; she is in pain (not sure if emotional or physical); was going to [facility] [place]; shot in each hip and it did help; the third shot did nothing; trochanteric bursitis; was on lodine; hurting below the knee; just saw him in May 2018; he wants her to do PT; has had this for years; does the same cycle; did a prednisone taper and that helped; she is willing to have me prescribe the prednisone; last round was earlier this year; no GI bleeding, no diabetes  Puppy dog just died and she had to put her down, 61 years old  Had her physical at GYN office, Dr. Charlotte Sanes, had mammo  She is having sinusitis; helping her son, they bought a new house, gone through it, mudding and sanding over it; a few days ago, so stopped up and congested; no fevers  Depression screen [facility] 2/9 07/09/2017 05/21/2016 11/17/2015  Decreased Interest 1 0 0  Down, Depressed, Hopeless 1 3 0  PHQ - 2 Score 2 3 0  Altered sleeping 3 1 -  Tired, decreased energy 0 0 -  Change in appetite 1 1 -  Feeling bad or failure about yourself  0 0 -  Trouble concentrating 0 0 -  Moving slowly or fidgety/restless 0 0 -  Suicidal thoughts 0 0 -  PHQ-9 Score 6 5 -  Difficult doing work/chores Somewhat difficult Somewhat difficult -    Relevant past medical, surgical, family and social history reviewed Past Medical History:  Diagnosis Date  . Allergy   . Anxiety   . Arthritis   . Degeneration of intervertebral disc of lumbar region   . Depression   .  Hyperlipidemia   . Impaired fasting glucose 05/21/2016  . Irritable bowel syndrome with constipation   . Obesity, Class I, BMI 30.0-34.9 (see actual BMI)   . Pelvic floor dysfunction   . Tobacco use 12/26/2015   Past Surgical History:  Procedure Laterality Date  . APPENDECTOMY    . BLADDER SURGERY    . BREAST CYST ASPIRATION Left    1999  . HEMORRHOID SURGERY    . TONSILLECTOMY     Family History  Problem Relation Age of Onset  . Diabetes Father   . Heart disease Father   . Hypothyroidism Father   . Hypertension Father   . Leukemia Father   . Cancer Father   . Intracerebral hemorrhage Mother   . AAA (abdominal aortic aneurysm) Mother   . Stroke Mother   . Hypothyroidism Sister   . Hypertension Sister   . Stroke Maternal Grandmother   . Heart disease Maternal Grandmother   . Cancer Maternal Grandfather        ?  . Diabetes Paternal Grandmother   . Heart attack Paternal Grandfather   . Hypothyroidism Sister   . Hypertension  Sister   . Hypothyroidism Sister   . Hypertension Sister   . Breast cancer Neg Hx    Social History   Social History  . Marital status: Single    Spouse name: N/A  . Number of children: N/A  . Years of education: N/A   Occupational History  . Not on file.   Social History Main Topics  . Smoking status: Current Every Day Smoker    Packs/day: 1.00    Years: 30.00    Types: Cigarettes  . Smokeless tobacco: Never Used  . Alcohol use 0.0 oz/week     Comment: very rare  . Drug use: No  . Sexual activity: Not Currently   Other Topics Concern  . Not on file   Social History Narrative  . No narrative on file    Interim medical history since last visit reviewed. Allergies and medications reviewed  Review of Systems Per HPI unless specifically indicated above     Objective:    BP 122/68   Pulse 74   Temp 97.9 F (36.6 C) (Oral)   Resp 16   Wt 183 lb 6.4 oz (83.2 kg)   SpO2 97%   BMI 31.48 kg/m   Wt Readings from Last 3  Encounters:  07/09/17 183 lb 6.4 oz (83.2 kg)  05/21/16 190 lb (86.2 kg)  12/26/15 187 lb 9.6 oz (85.1 kg)    Physical Exam  Constitutional: She appears well-developed and well-nourished. No distress.  Weight loss noted  HENT:  Right Ear: Tympanic membrane is not erythematous. No middle ear effusion.  Left Ear: Tympanic membrane is not erythematous.  No middle ear effusion.  Nose: Rhinorrhea (clear) present. Right sinus exhibits no maxillary sinus tenderness and no frontal sinus tenderness. Left sinus exhibits no maxillary sinus tenderness and no frontal sinus tenderness.  Mouth/Throat: Oropharynx is clear and moist and mucous membranes are normal.  Eyes: EOM are normal. No scleral icterus.  Neck: No thyromegaly present.  Cardiovascular: Normal rate, regular rhythm and normal heart sounds.   No murmur heard. Pulmonary/Chest: Effort normal and breath sounds normal. No respiratory distress. She has no wheezes.  Abdominal: Soft. Bowel sounds are normal. She exhibits no distension.  Musculoskeletal: Normal range of motion. She exhibits no edema.  Lymphadenopathy:    She has no cervical adenopathy.  Neurological: She is alert. She displays no tremor. She exhibits normal muscle tone. Gait normal.  Skin: Skin is warm and dry. She is not diaphoretic. No pallor.  Psychiatric: She has a normal mood and affect. Her behavior is normal. Judgment and thought content normal.      Assessment & Plan:   Problem List Items Addressed This Visit      Musculoskeletal and Integument   Degeneration of intervertebral disc of lumbar region    Prednisone taper; no red flags; NO NSAIDs while on predniosone      Relevant Medications   tiZANidine (ZANAFLEX) 4 MG tablet   acetaminophen (TYLENOL) 500 MG tablet   predniSONE (STERAPRED UNI-PAK 21 TAB) 10 MG (21) TBPK tablet     Other   Obesity    Encouragement, good progress      Anxiety and depression    Start back on escitalopram; reasons to call  reviewed; close f/u       Other Visit Diagnoses    Screening for osteoporosis    -  Primary   Relevant Orders   DG Bone Density       Follow up plan: Return in  about 4 weeks (around 08/06/2017) for follow-up visit with Dr. Sanda Klein.  An after-visit summary was printed and given to the patient at [facility].  Please see the patient instructions which may contain other information and recommendations beyond what is mentioned above in the assessment and plan.  Meds ordered this encounter  Medications  . tiZANidine (ZANAFLEX) 4 MG tablet    Sig: Take 4 mg by mouth at bedtime.  Marland Kitchen acetaminophen (TYLENOL) 500 MG tablet    Sig: Take 500 mg by mouth every 8 (eight) hours as needed.  Marland Kitchen DISCONTD: ibuprofen (ADVIL,MOTRIN) 200 MG tablet    Sig: Take 200 mg by mouth every 8 (eight) hours as needed.  . predniSONE (STERAPRED UNI-PAK 21 TAB) 10 MG (21) TBPK tablet    Sig: Take as directed, take with food    Dispense:  21 tablet    Refill:  0  . DULoxetine (CYMBALTA) 30 MG capsule    Sig: One by mouth daily for one week, then two a day    Dispense:  53 capsule    Refill:  0    Orders Placed This Encounter  Procedures  . DG Bone Density

## 2017-07-14 NOTE — Assessment & Plan Note (Signed)
Prednisone taper; no red flags; NO NSAIDs while on predniosone

## 2017-07-14 NOTE — Assessment & Plan Note (Signed)
Start back on escitalopram; reasons to call reviewed; close f/u

## 2017-07-14 NOTE — Assessment & Plan Note (Signed)
Encouragement, good progress

## 2017-08-07 ENCOUNTER — Other Ambulatory Visit: Payer: Self-pay | Admitting: Family Medicine

## 2017-08-08 MED ORDER — DULOXETINE HCL 60 MG PO CPEP: 60.0000 mg | ORAL_CAPSULE | Freq: Every day | ORAL | 0 refills | Status: DC

## 2017-08-08 NOTE — Telephone Encounter (Signed)
Rx sent 

## 2017-08-08 NOTE — Telephone Encounter (Signed)
Pt called stating she has an appt on Monday and does not have enough medication to last until and is asking for just enough to last her to them.

## 2017-08-11 ENCOUNTER — Encounter: Payer: Self-pay | Admitting: Family Medicine

## 2017-08-11 ENCOUNTER — Ambulatory Visit (INDEPENDENT_AMBULATORY_CARE_PROVIDER_SITE_OTHER): Payer: Commercial Managed Care - PPO | Admitting: Family Medicine

## 2017-08-11 DIAGNOSIS — Z720 Tobacco use: Secondary | ICD-10-CM

## 2017-08-11 DIAGNOSIS — F419 Anxiety disorder, unspecified: Secondary | ICD-10-CM

## 2017-08-11 DIAGNOSIS — M5090 Cervical disc disorder, unspecified, unspecified cervical region: Secondary | ICD-10-CM | POA: Diagnosis not present

## 2017-08-11 DIAGNOSIS — F329 Major depressive disorder, single episode, unspecified: Secondary | ICD-10-CM

## 2017-08-11 MED ORDER — ESCITALOPRAM OXALATE 10 MG PO TABS: 5.0000 mg | ORAL_TABLET | Freq: Every day | ORAL | 0 refills | Status: DC

## 2017-08-11 MED ORDER — DULOXETINE HCL 30 MG PO CPEP: 30.0000 mg | ORAL_CAPSULE | Freq: Every day | ORAL | 0 refills | Status: DC

## 2017-08-11 NOTE — Patient Instructions (Signed)
For the next week, take 30 mg of Cymbalta plus 5 mg of Lexapro, then no Cymbalta and 10 mg of Lexapro Call me with any problems or issues Please do check in by phone in 3-4 weeks and let one of the providers know how you're doing; we can increase your dose to 20 mg daily if needed

## 2017-08-11 NOTE — Progress Notes (Signed)
BP 116/72   Pulse 97   Temp 98.5 F (36.9 C) (Oral)   Resp 16   Wt 185 lb 6.4 oz (84.1 kg)   SpO2 97%   BMI 31.82 kg/m    Subjective:    Patient ID: Megan Nichols, female    DOB: 1956/03/19, 61 y.o.   MRN: [id number]  HPI: Megan Nichols is a 61 y.o. female  Chief Complaint  Patient presents with  . Medication Problem    Medication montoring F/U' Pt states she's experiencing side effects on cymbalta. Sweating, overeating and wakning up every hour.     HPI Patient is here for f/u  She is having issues with the medicine For the first week, was okay; by the higher dose, she was having sweating, felt feverish, achy, dry mouth Still having the pain and it's not gone; still in pain in her hip and ankle No dark thoughts or SI/HI I asked about her smoking, if she smoked any less on the medicine; would smoke more when depressed; wasn't zoning in on her mother dying and putting her puppy dog down; did not have to smoke as much She is fairly active  She uses the tizanidine for her neck, just PRN; didn't work well with cymbalta, took a dose last night; she has helped remodel her son's house, busy with that too; also helped with her family member who lost a son  When she gets up, hurts; okay when up and about; seeing [facility] [place] Going to chiropractor, Dr. Joyce Copa  Depression screen [facility] 2/9 08/11/2017 07/09/2017 05/21/2016 11/17/2015  Decreased Interest 0 1 0 0  Down, Depressed, Hopeless 0 1 3 0  PHQ - 2 Score 0 2 3 0  Altered sleeping 1 3 1  -  Tired, decreased energy 0 0 0 -  Change in appetite 1 1 1  -  Feeling bad or failure about yourself  0 0 0 -  Trouble concentrating 0 0 0 -  Moving slowly or fidgety/restless 0 0 0 -  Suicidal thoughts 0 0 0 -  PHQ-9 Score 2 6 5  -  Difficult doing work/chores - Somewhat difficult Somewhat difficult -   Relevant past medical, surgical, family and social history reviewed Past Medical History:  Diagnosis Date  . Allergy   . Anxiety    . Arthritis   . Degeneration of intervertebral disc of lumbar region   . Depression   . Hyperlipidemia   . Impaired fasting glucose 05/21/2016  . Irritable bowel syndrome with constipation   . Obesity, Class I, BMI 30.0-34.9 (see actual BMI)   . Pelvic floor dysfunction   . Tobacco use 12/26/2015   Past Surgical History:  Procedure Laterality Date  . APPENDECTOMY    . BLADDER SURGERY    . BREAST CYST ASPIRATION Left    1999  . HEMORRHOID SURGERY    . TONSILLECTOMY     Family History  Problem Relation Age of Onset  . Diabetes Father   . Heart disease Father   . Hypothyroidism Father   . Hypertension Father   . Leukemia Father   . Cancer Father   . Intracerebral hemorrhage Mother   . AAA (abdominal aortic aneurysm) Mother   . Stroke Mother   . Hypothyroidism Sister   . Hypertension Sister   . Stroke Maternal Grandmother   . Heart disease Maternal Grandmother   . Cancer Maternal Grandfather        ?  . Diabetes Paternal Grandmother   .  Heart attack Paternal Grandfather   . Hypothyroidism Sister   . Hypertension Sister   . Hypothyroidism Sister   . Hypertension Sister   . Breast cancer Neg Hx    Social History   Social History  . Marital status: Single    Spouse name: N/A  . Number of children: N/A  . Years of education: N/A   Occupational History  . Not on file.   Social History Main Topics  . Smoking status: Current Every Day Smoker    Packs/day: 1.00    Years: 30.00    Types: Cigarettes  . Smokeless tobacco: Never Used  . Alcohol use 0.0 oz/week     Comment: very rare  . Drug use: No  . Sexual activity: Not Currently   Other Topics Concern  . Not on file   Social History Narrative  . No narrative on file    Interim medical history since last visit reviewed. Allergies and medications reviewed  Review of Systems Per HPI unless specifically indicated above     Objective:    BP 116/72   Pulse 97   Temp 98.5 F (36.9 C) (Oral)   Resp  16   Wt 185 lb 6.4 oz (84.1 kg)   SpO2 97%   BMI 31.82 kg/m   Wt Readings from Last 3 Encounters:  08/11/17 185 lb 6.4 oz (84.1 kg)  07/09/17 183 lb 6.4 oz (83.2 kg)  05/21/16 190 lb (86.2 kg)    Physical Exam  Constitutional: She appears well-developed and well-nourished.  HENT:  Mouth/Throat: Mucous membranes are normal.  Eyes: EOM are normal. No scleral icterus.  Cardiovascular: Normal rate and regular rhythm.   Pulmonary/Chest: Effort normal and breath sounds normal.  Abdominal: She exhibits no distension.  Psychiatric: She has a normal mood and affect. Her behavior is normal.   Results for orders placed or performed in visit on 05/21/16  Hemoglobin A1c  Result Value Ref Range   Hgb A1c MFr Bld 5.6 4.8 - 5.6 %   Est. average glucose Bld gHb Est-mCnc 114 mg/dL  CBC with Differential/Platelet  Result Value Ref Range   WBC 5.8 3.4 - 10.8 x10E3/uL   RBC 4.76 3.77 - 5.28 x10E6/uL   Hemoglobin 14.9 11.1 - 15.9 g/dL   Hematocrit 43.3 34.0 - 46.6 %   MCV 91 79 - 97 fL   MCH 31.3 26.6 - 33.0 pg   MCHC 34.4 31.5 - 35.7 g/dL   RDW 14.7 12.3 - 15.4 %   Platelets 299 150 - 379 x10E3/uL   Neutrophils 51 %   Lymphs 35 %   Monocytes 11 %   Eos 2 %   Basos 1 %   Neutrophils Absolute 3.0 1.4 - 7.0 x10E3/uL   Lymphocytes Absolute 2.0 0.7 - 3.1 x10E3/uL   Monocytes Absolute 0.7 0.1 - 0.9 x10E3/uL   EOS (ABSOLUTE) 0.1 0.0 - 0.4 x10E3/uL   Basophils Absolute 0.0 0.0 - 0.2 x10E3/uL   Immature Granulocytes 0 %   Immature Grans (Abs) 0.0 0.0 - 0.1 x10E3/uL  Comprehensive metabolic panel  Result Value Ref Range   Glucose 91 65 - 99 mg/dL   BUN 15 8 - 27 mg/dL   Creatinine, Ser 0.63 0.57 - 1.00 mg/dL   GFR calc non Af Amer 98 >59 mL/min/1.73   GFR calc Af Amer 113 >59 mL/min/1.73   BUN/Creatinine Ratio 24 12 - 28   Sodium 140 134 - 144 mmol/L   Potassium 4.4 3.5 - 5.2 mmol/L  Chloride 103 96 - 106 mmol/L   CO2 23 18 - 29 mmol/L   Calcium 9.2 8.7 - 10.3 mg/dL   Total Protein 6.3  6.0 - 8.5 g/dL   Albumin 4.2 3.6 - 4.8 g/dL   Globulin, Total 2.1 1.5 - 4.5 g/dL   Albumin/Globulin Ratio 2.0 1.2 - 2.2   Bilirubin Total 0.3 0.0 - 1.2 mg/dL   Alkaline Phosphatase 67 39 - 117 IU/L   AST 14 0 - 40 IU/L   ALT 11 0 - 32 IU/L  Lipid Panel w/o Chol/HDL Ratio  Result Value Ref Range   Cholesterol, Total 232 (H) 100 - 199 mg/dL   Triglycerides 73 0 - 149 mg/dL   HDL 76 >39 mg/dL   VLDL Cholesterol Cal 15 5 - 40 mg/dL   LDL Calculated 141 (H) 0 - 99 mg/dL  TSH  Result Value Ref Range   TSH 1.760 0.450 - 4.500 uIU/mL      Assessment & Plan:   Problem List Items Addressed This Visit      Musculoskeletal and Integument   Cervical disc disease    Using tizanidine PRN        Other   Tobacco use    I hope she will be able to cut down on her smoking; I am certainly here to help if/when she is ready to quit      Anxiety and depression    See AVS; will change medicines; patient to call with update, sooner if any issues          Follow up plan: No Follow-up on file.  An after-visit summary was printed and given to the patient at [facility].  Please see the patient instructions which may contain other information and recommendations beyond what is mentioned above in the assessment and plan.  Meds ordered this encounter  Medications  . cholecalciferol (VITAMIN D) 1000 units tablet    Sig: Take 1,000 Units by mouth daily.  . DULoxetine (CYMBALTA) 30 MG capsule    Sig: Take 1 capsule (30 mg total) by mouth daily. Until finished, then stop    Dispense:  7 capsule    Refill:  0    We're stopping the cymbalta completely, so weaning off  . escitalopram (LEXAPRO) 10 MG tablet    Sig: Take 0.5 tablets (5 mg total) by mouth daily. For the first 7 days, then one whole pill daily    Dispense:  27 tablet    Refill:  0    Tapering off of Cymbalta and starting Lexapro    No orders of the defined types were placed in this encounter.

## 2017-08-17 ENCOUNTER — Emergency Department
Admission: EM | Admit: 2017-08-17 | Discharge: 2017-08-17 | Disposition: A | Discharge status: Home / Self Care | Payer: Commercial Managed Care - PPO | Attending: Emergency Medicine | Admitting: Emergency Medicine

## 2017-08-17 DIAGNOSIS — F17210 Nicotine dependence, cigarettes, uncomplicated: Secondary | ICD-10-CM | POA: Diagnosis not present

## 2017-08-17 DIAGNOSIS — M62838 Other muscle spasm: Secondary | ICD-10-CM | POA: Diagnosis not present

## 2017-08-17 DIAGNOSIS — M436 Torticollis: Secondary | ICD-10-CM | POA: Insufficient documentation

## 2017-08-17 DIAGNOSIS — M542 Cervicalgia: Secondary | ICD-10-CM | POA: Diagnosis present

## 2017-08-17 MED ORDER — PREDNISONE 10 MG (21) PO TBPK: ORAL_TABLET | ORAL | 0 refills | Status: DC

## 2017-08-17 MED ORDER — DEXAMETHASONE SODIUM PHOSPHATE 10 MG/ML IJ SOLN
10.0000 mg | Freq: Once | INTRAMUSCULAR | Status: AC
  Administered 2017-08-17: 10 mg via INTRAMUSCULAR
  Filled 2017-08-17: qty 1

## 2017-08-17 MED ORDER — CARISOPRODOL 350 MG PO TABS: 350.0000 mg | ORAL_TABLET | Freq: Three times a day (TID) | ORAL | 1 refills | Status: AC | PRN

## 2017-08-17 MED ORDER — CARISOPRODOL 350 MG PO TABS
350.0000 mg | ORAL_TABLET | Freq: Once | ORAL | Status: AC
  Administered 2017-08-17: 350 mg via ORAL
  Filled 2017-08-17: qty 1

## 2017-08-17 NOTE — ED Triage Notes (Signed)
Stiff left side of neck, spasms down into left shoulder. Pt doing remodeling at home and has been doing a lot of physical work. Pt alert and oriented X4, active, cooperative, pt in NAD. RR even and unlabored, color WNL.

## 2017-08-17 NOTE — Discharge Instructions (Signed)
Take medication as prescribed. Return to emergency department if symptoms worsen and follow-up with PCP as needed.   °

## 2017-08-17 NOTE — ED Provider Notes (Signed)
[facility] Emergency Department Provider Note   ____________________________________________   I have reviewed the triage vital signs and the nursing notes.   HISTORY  Chief Complaint Torticollis    HPI Iraida Cragin is a 61 y.o. female presents to the emergency department with left-sided neck spasms with radiating muscle spasms and pain into the left shoulder that developed after remodeling work in her home over the last 2 days. Patient notes muscle spasming is causing her neck to position and a rotation and lateral flexion position with constant pain. Patient denies sustaining any injury she fell she is "just over done it" causing current symptoms. Patient has experienced these symptoms in the past. Patient denies fever, chills, headache, vision changes, chest pain, chest tightness, shortness of breath, abdominal pain, nausea and vomiting.  Past Medical History:  Diagnosis Date  . Allergy   . Anxiety   . Arthritis   . Degeneration of intervertebral disc of lumbar region   . Depression   . Hyperlipidemia   . Impaired fasting glucose 05/21/2016  . Irritable bowel syndrome with constipation   . Obesity, Class I, BMI 30.0-34.9 (see actual BMI)   . Pelvic floor dysfunction   . Tobacco use 12/26/2015    Patient Active Problem List   Diagnosis Date Noted  . Impaired fasting glucose 05/21/2016  . Obesity 05/21/2016  . Encounter for smoking cessation counseling 12/26/2015  . Tobacco use 12/26/2015  . Cervical disc disease 11/17/2015  . Other fatigue 11/17/2015  . Arthritis 11/17/2015  . Degeneration of intervertebral disc of lumbar region 11/17/2015  . Anxiety and depression 11/17/2015  . Irritable bowel syndrome with constipation 11/17/2015  . Need for pneumococcal vaccination 11/17/2015  . Hyperlipidemia LDL goal <100 07/14/2014    Past Surgical History:  Procedure Laterality Date  . APPENDECTOMY    . BLADDER SURGERY    . BREAST CYST  ASPIRATION Left    1999  . HEMORRHOID SURGERY    . TONSILLECTOMY      Prior to Admission medications   Medication Sig Start Date End Date Taking? Authorizing Provider  acetaminophen (TYLENOL) 500 MG tablet Take 500 mg by mouth every 8 (eight) hours as needed.    [provider]  carisoprodol (SOMA) 350 MG tablet Take 1 tablet (350 mg total) by mouth 3 (three) times daily as needed for muscle spasms. 08/17/17 08/24/17  Demaree Liberto M, PA-C  cholecalciferol (VITAMIN D) 1000 units tablet Take 1,000 Units by mouth daily.    [provider]  DULoxetine (CYMBALTA) 30 MG capsule Take 1 capsule (30 mg total) by mouth daily. Until finished, then stop 08/11/17   Lada, Satira Anis, MD  escitalopram (LEXAPRO) 10 MG tablet Take 0.5 tablets (5 mg total) by mouth daily. For the first 7 days, then one whole pill daily 08/11/17   Lada, Satira Anis, MD  predniSONE (STERAPRED UNI-PAK 21 TAB) 10 MG (21) TBPK tablet Take 6 tablets on day 1. Take 5 tablets on day 2. Take 4 tablets on day 3. Take 3 tablets on day 4. Take 2 tablets on day 5. Take 1 tablets on day 6. 08/17/17   Avonna Iribe M, PA-C  tiZANidine (ZANAFLEX) 4 MG tablet Take 4 mg by mouth daily as needed.  05/13/17   [provider]    Allergies Patient has no known allergies.  Family History  Problem Relation Age of Onset  . Diabetes Father   . Heart disease Father   . Hypothyroidism Father   .  Hypertension Father   . Leukemia Father   . Cancer Father   . Intracerebral hemorrhage Mother   . AAA (abdominal aortic aneurysm) Mother   . Stroke Mother   . Hypothyroidism Sister   . Hypertension Sister   . Stroke Maternal Grandmother   . Heart disease Maternal Grandmother   . Cancer Maternal Grandfather        ?  . Diabetes Paternal Grandmother   . Heart attack Paternal Grandfather   . Hypothyroidism Sister   . Hypertension Sister   . Hypothyroidism Sister   . Hypertension Sister   . Breast cancer Neg Hx     Social  History Social History  Substance Use Topics  . Smoking status: Current Every Day Smoker    Packs/day: 1.00    Years: 30.00    Types: Cigarettes  . Smokeless tobacco: Never Used  . Alcohol use 0.0 oz/week     Comment: very rare    Review of Systems Constitutional: Negative for fever/chills Eyes: No visual changes. ENT:  Negative for sore throat and for difficulty swallowing Cardiovascular: Denies chest pain. Respiratory: Denies cough. Denies shortness of breath. Gastrointestinal: No abdominal pain.  No nausea, vomiting, diarrhea. Genitourinary: Negative for dysuria. Musculoskeletal: Positive for left-sided cervical musculature pain and muscle spasms. Skin: Negative for rash. Neurological: Negative for headaches.  Negative focal weakness or numbness. Negative for loss of consciousness. Able to ambulate. ____________________________________________   PHYSICAL EXAM:  VITAL SIGNS: ED Triage Vitals  Enc Vitals Group     BP 08/17/17 1757 (!) 151/79     Pulse --      Resp 08/17/17 1757 16     Temp 08/17/17 1757 99.3 F (37.4 C)     Temp Source 08/17/17 1757 Oral     SpO2 08/17/17 1757 96 %     Weight --      Height --      Head Circumference --      Peak Flow --      Pain Score 08/17/17 1807 8     Pain Loc --      Pain Edu? --      Excl. in [place]? --     Constitutional: Alert and oriented. Well appearing and in no acute distress.  Eyes: Conjunctivae are normal. PERRL. EOMI  Head: Normocephalic and atraumatic. ENT:      Ears: Canals clear. TMs intact bilaterally.      Nose: No congestion/rhinnorhea.      Mouth/Throat: Mucous membranes are moist.  Neck:Supple. No thyromegaly. No stridor.  Cardiovascular: Normal rate, regular rhythm. Normal S1 and S2.  Good peripheral circulation. Respiratory: Normal respiratory effort without tachypnea or retractions. Lungs CTAB. No wheezes/rales/rhonchi. Good air entry to the bases with no decreased or absent breath  sounds. Hematological/Lymphatic/Immunological: No cervical lymphadenopathy. Cardiovascular: Normal rate, regular rhythm. Normal distal pulses. Gastrointestinal: Bowel sounds 4 quadrants. Soft and nontender to palpation. No guarding or rigidity. No palpable masses. No distention. No CVA tenderness. Musculoskeletal: Left side cervical paraspinal, left upper trapezius, left sternocleidomastoid palpable tenderness, spasming causing rotation and lateral flexion. Nontender with normal range of motion in all extremities. No bony restrictions noted on assessment of cervical spine range of motion. Neurologic: Normal speech and language. No gross focal neurologic deficits are appreciated. Skin:  Skin is warm, dry and intact. No rash noted. Psychiatric: Mood and affect are normal. Speech and behavior are normal. Patient exhibits appropriate insight and judgement.  ____________________________________________   LABS (all labs ordered are listed, but  only abnormal results are displayed)  Labs Reviewed - No data to display ____________________________________________  EKG none ____________________________________________  RADIOLOGY None ____________________________________________   PROCEDURES  Procedure(s) performed:     Critical Care performed: no ____________________________________________   INITIAL IMPRESSION / ASSESSMENT AND PLAN / ED COURSE  Pertinent labs & imaging results that were available during my care of the patient were reviewed by me and considered in my medical decision making (see chart for details).  Patient presents to emergency department with left side cervical pain. History and physical exam findings are reassuring symptoms are consistent with acquired torticollis. Patient noted improvement of symptoms following Decadron and Soma during the course of care in the emergency department. Patient will be prescribed prednisone taper and Soma as needed for muscle spasms.  Patient advised to follow up with PCP or her orthopedic provider as needed or return to the emergency department if symptoms return or worsen. Patient informed of clinical course, understand medical decision-making process, and agree with plan.  ____________________________________________   FINAL CLINICAL IMPRESSION(S) / ED DIAGNOSES  Final diagnoses:  Torticollis, acquired  Muscle spasms of neck       NEW MEDICATIONS STARTED DURING THIS VISIT:  Discharge Medication List as of 08/17/2017  7:42 PM    START taking these medications   Details  carisoprodol (SOMA) 350 MG tablet Take 1 tablet (350 mg total) by mouth 3 (three) times daily as needed for muscle spasms., Starting Sun 08/17/2017, Until Sun 08/24/2017, Print    predniSONE (STERAPRED UNI-PAK 21 TAB) 10 MG (21) TBPK tablet Take 6 tablets on day 1. Take 5 tablets on day 2. Take 4 tablets on day 3. Take 3 tablets on day 4. Take 2 tablets on day 5. Take 1 tablets on day 6., Print         Note:  This document was prepared using Dragon voice recognition software and may include unintentional dictation errors.    Raylon Lamson, Laroy Apple, PA-C 08/18/17 0005    Lavonia Drafts, MD 08/25/17 1416

## 2017-08-20 NOTE — Assessment & Plan Note (Signed)
I hope she will be able to cut down on her smoking; I am certainly here to help if/when she is ready to quit

## 2017-08-20 NOTE — Assessment & Plan Note (Signed)
Using tizanidine PRN

## 2017-08-20 NOTE — Assessment & Plan Note (Signed)
See AVS; will change medicines; patient to call with update, sooner if any issues

## 2017-09-03 ENCOUNTER — Other Ambulatory Visit: Payer: Self-pay | Admitting: Family Medicine

## 2017-09-05 NOTE — Telephone Encounter (Signed)
Sending one refill, but needs follow up with Dr. Sanda Klein in about 3-4 weeks

## 2017-09-05 NOTE — Telephone Encounter (Signed)
Left a detail messaged  

## 2017-10-08 ENCOUNTER — Telehealth: Payer: Self-pay | Admitting: Family Medicine

## 2017-10-08 NOTE — Telephone Encounter (Signed)
Pt needs refill on Lexapro to be sent to [facility]. Pt states she is down to 2 pills.

## 2017-10-08 NOTE — Telephone Encounter (Signed)
Left voice mail to call back 

## 2017-10-08 NOTE — Telephone Encounter (Signed)
Patient has an appt scheduled for tomorrow will get refill then.

## 2017-10-09 ENCOUNTER — Ambulatory Visit (INDEPENDENT_AMBULATORY_CARE_PROVIDER_SITE_OTHER): Payer: Commercial Managed Care - PPO | Admitting: Family Medicine

## 2017-10-09 ENCOUNTER — Telehealth: Payer: Self-pay | Admitting: Family Medicine

## 2017-10-09 ENCOUNTER — Other Ambulatory Visit: Payer: Self-pay

## 2017-10-09 MED ORDER — ESCITALOPRAM OXALATE 10 MG PO TABS: 10.0000 mg | ORAL_TABLET | Freq: Every day | ORAL | 5 refills | Status: DC

## 2017-10-09 NOTE — Telephone Encounter (Signed)
appt cancelled

## 2017-10-09 NOTE — Telephone Encounter (Signed)
I just looked at my afternoon schedule and see that the patient is there I reviewed my last note; I did not need to see her back in follow-up for refills; just phone check in I left message for her on identified voicemail If she just needs refill and is doing well without other issues, I don't need to see her, just call for refill If other issues or not doing well, I'll be happy to see her today

## 2017-11-27 ENCOUNTER — Other Ambulatory Visit: Payer: Self-pay

## 2017-11-27 MED ORDER — ESCITALOPRAM OXALATE 10 MG PO TABS: 10.0000 mg | ORAL_TABLET | Freq: Every day | ORAL | 1 refills | Status: DC

## 2017-11-27 NOTE — Telephone Encounter (Signed)
Insurance request 90 days.

## 2017-12-15 ENCOUNTER — Ambulatory Visit (INDEPENDENT_AMBULATORY_CARE_PROVIDER_SITE_OTHER): Payer: Commercial Managed Care - PPO | Admitting: Family Medicine

## 2017-12-15 ENCOUNTER — Ambulatory Visit
Admission: RE | Admit: 2017-12-15 | Discharge: 2017-12-15 | Disposition: A | Discharge status: Home / Self Care | Payer: Commercial Managed Care - PPO | Source: Ambulatory Visit | Attending: Family Medicine | Admitting: Family Medicine

## 2017-12-15 ENCOUNTER — Encounter: Payer: Self-pay | Admitting: Family Medicine

## 2017-12-15 VITALS — BP 116/74 | HR 78 | Temp 98.1°F | Resp 18 | Wt 186.3 lb

## 2017-12-15 DIAGNOSIS — R059 Cough, unspecified: Secondary | ICD-10-CM

## 2017-12-15 DIAGNOSIS — R05 Cough: Secondary | ICD-10-CM

## 2017-12-15 DIAGNOSIS — I700 Atherosclerosis of aorta: Secondary | ICD-10-CM | POA: Diagnosis not present

## 2017-12-15 DIAGNOSIS — Z720 Tobacco use: Secondary | ICD-10-CM

## 2017-12-15 DIAGNOSIS — R0602 Shortness of breath: Secondary | ICD-10-CM | POA: Diagnosis not present

## 2017-12-15 DIAGNOSIS — F17200 Nicotine dependence, unspecified, uncomplicated: Secondary | ICD-10-CM | POA: Insufficient documentation

## 2017-12-15 DIAGNOSIS — R0981 Nasal congestion: Secondary | ICD-10-CM

## 2017-12-15 MED ORDER — ALBUTEROL SULFATE HFA 108 (90 BASE) MCG/ACT IN AERS: 1.0000 | INHALATION_SPRAY | Freq: Four times a day (QID) | RESPIRATORY_TRACT | 0 refills | Status: DC | PRN

## 2017-12-15 MED ORDER — AZELASTINE-FLUTICASONE 137-50 MCG/ACT NA SUSP: 2.0000 | Freq: Every day | NASAL | 3 refills | Status: DC

## 2017-12-15 MED ORDER — BENZONATATE 100 MG PO CAPS: 100.0000 mg | ORAL_CAPSULE | Freq: Three times a day (TID) | ORAL | 0 refills | Status: DC | PRN

## 2017-12-15 NOTE — Progress Notes (Addendum)
Name: Megan Nichols   MRN: [id number]    DOB: 1956/03/14   Date:12/15/2017       Progress Note  Subjective  Chief Complaint  Chief Complaint  Patient presents with  . Cough    Cough, Right ear fluid and running nose for about a month. PT denies any other symptoms. Pt states she was started on an antibotic a month or so ago but it did not help     HPI  Pt presents to follow up on ongoing congested cough (occasionally productive), nasal congestion, post-nasal drainage, right intermittent ear pain for >2 months.  Was seen on 11/24/2017 at [facility] walk-in clinic for sinusitis, was given doxycycline for sinusitis and Bromfed DM for cough. None of which provided relief.  Was also seen 09/04/2017 for bronchitis at the [facility] walk-in clinic and was given Augmentin, Codeine-guaifenesin, tessalon, albuterol also without relief of symptoms.  Also endorses mild shortness of breath with over-exertion only.  Notes she adopted a cat almost a year ago and questions if she could be allergic.  Reports having history of similar illness about 5 years ago - states cough and nasal congestion lasted for about 3-4 months. Pt is current smoker - x35years, 1/2ppd - discussed increased risk of lung CA, bronchitis and COPD with long-term and ongoing tobacco use. - Denies fevers, chills, fatigue, NVD, abdominal pain, chest pain, heart palpitations, body aches, night sweats, recent travel.  Patient Active Problem List   Diagnosis Date Noted  . Impaired fasting glucose 05/21/2016  . Obesity 05/21/2016  . Encounter for smoking cessation counseling 12/26/2015  . Tobacco use 12/26/2015  . Cervical disc disease 11/17/2015  . Other fatigue 11/17/2015  . Arthritis 11/17/2015  . Degeneration of intervertebral disc of lumbar region 11/17/2015  . Anxiety and depression 11/17/2015  . Irritable bowel syndrome with constipation 11/17/2015  . Need for pneumococcal vaccination 11/17/2015  . Hyperlipidemia LDL goal  <100 07/14/2014    Social History   Tobacco Use  . Smoking status: Current Every Day Smoker    Packs/day: 1.00    Years: 30.00    Pack years: 30.00    Types: Cigarettes  . Smokeless tobacco: Never Used  Substance Use Topics  . Alcohol use: Yes    Alcohol/week: 0.0 oz    Comment: very rare     Current Outpatient Medications:  .  acetaminophen (TYLENOL) 500 MG tablet, Take 500 mg by mouth every 8 (eight) hours as needed., Disp: , Rfl:  .  cholecalciferol (VITAMIN D) 1000 units tablet, Take 1,000 Units by mouth daily., Disp: , Rfl:  .  escitalopram (LEXAPRO) 10 MG tablet, Take 1 tablet (10 mg total) by mouth daily., Disp: 90 tablet, Rfl: 1 .  tiZANidine (ZANAFLEX) 4 MG tablet, Take 4 mg by mouth daily as needed. , Disp: , Rfl:   No Known Allergies  ROS  Constitutional: Negative for fever or weight change.  Respiratory: Positive for cough and shortness of breath.   Cardiovascular: Negative for chest pain or palpitations.  Gastrointestinal: Negative for abdominal pain, no bowel changes.  Musculoskeletal: Negative for gait problem or joint swelling.  Skin: Negative for rash.  Neurological: Negative for dizziness or headache.  No other specific complaints in a complete review of systems (except as listed in HPI above).  Objective  Vitals:   12/15/17 1005  BP: 116/74  Pulse: 78  Resp: 18  Temp: 98.1 F (36.7 C)  TempSrc: Oral  SpO2: 98%  Weight: 186 lb 4.8 oz (  84.5 kg)   Body mass index is 31.98 kg/m.  Nursing Note and Vital Signs reviewed.  Physical Exam  Constitutional: Patient appears well-developed and well-nourished. Obese. No distress.  HEENT: head atraumatic, normocephalic, pupils equal and reactive to light, EOM's intact, TM's without erythema or bulging, no fontal sinus tenderness; mild tenderness to bilateral maxillary sinuses, neck supple without lymphadenopathy, oropharynx pink and moist without exudate Cardiovascular: Normal rate, regular rhythm, S1/S2  present.  No murmur or rub heard. No BLE edema. Pulmonary/Chest: Effort normal and breath sounds clear. No respiratory distress or retractions. Psychiatric: Patient has a normal mood and affect. behavior is normal. Judgment and thought content normal.  No results found for this or any previous visit (from the past 2160 hour(s)).   Assessment & Plan  1. Cough in adult - benzonatate (TESSALON PERLES) 100 MG capsule; Take 1-2 capsules (100-200 mg total) by mouth 3 (three) times daily as needed.  Dispense: 60 capsule; Refill: 0 - DG Chest 2 View; Future  2. Shortness of breath - albuterol (PROVENTIL HFA;VENTOLIN HFA) 108 (90 Base) MCG/ACT inhaler; Inhale 1-2 puffs into the lungs every 6 (six) hours as needed for wheezing or shortness of breath.  Dispense: 1 Inhaler; Refill: 0 - DG Chest 2 View; Future  - Discussed commonality of lingering cough and shortness of breath with bronchitis, and we will treat with supportive measures as pt has had 2 rounds of antibiotic therapy. Will await CXR to determine if additional intervention is warranted.  3. Nasal congestion - Azelastine-Fluticasone 137-50 MCG/ACT SUSP; Place 2 sprays into the nose daily.  Dispense: 23 g; Refill: 3 - Pt declines to take PO antihistamines because they make her drowsy - even 2nd generation. We will try Dymista instead. Advised that if symptoms do not improve and/or worsen, she needs to return and we will consider referral to ENT for further evaluation.  4. Tobacco use - Cessation discussed in detail.  -Red flags and when to present for emergency care or RTC including fever >101.84F, chest pain, shortness of breath unrelieved by albuterol inhaler, new/worsening/un-resolving symptoms,  reviewed with patient at time of visit. Follow up and care instructions discussed and provided in AVS. - Return for 2-3 week Cough follow up; sooner if worsening.

## 2017-12-15 NOTE — Patient Instructions (Addendum)
- Please go to [facility] for Chest Xray today.  Allergies An allergy is when your body reacts to a substance in a way that is not normal. An allergic reaction can happen after you:  Eat something.  Breathe in something.  Touch something.  You can be allergic to:  Things that are only around during certain seasons, like molds and pollens.  Foods.  Drugs.  Insects.  Animal dander.  What are the signs or symptoms?  Puffiness (swelling). This may happen on the lips, face, tongue, mouth, or throat.  Sneezing.  Coughing.  Breathing loudly (wheezing).  Stuffy nose.  Tingling in the mouth.  A rash.  Itching.  Itchy, red, puffy areas of skin (hives).  Watery eyes.  Throwing up (vomiting).  Watery poop (diarrhea).  Dizziness.  Feeling faint or fainting.  Trouble breathing or swallowing.  A tight feeling in the chest.  A fast heartbeat. How is this diagnosed? Allergies can be diagnosed with:  A medical and family history.  Skin tests.  Blood tests.  A food diary. A food diary is a record of all the foods, drinks, and symptoms you have each day.  The results of an elimination diet. This diet involves making sure not to eat certain foods and then seeing what happens when you start eating them again.  How is this treated? There is no cure for allergies, but allergic reactions can be treated with medicine. Severe reactions usually need to be treated at a hospital. How is this prevented? The best way to prevent an allergic reaction is to avoid the thing you are allergic to. Allergy shots and medicines can also help prevent reactions in some cases. This information is not intended to replace advice given to you by your health care provider. Make sure you discuss any questions you have with your health care provider. Document Released: 04/12/2013 Document Revised: 08/12/2016 Document Reviewed: 09/27/2014 Elsevier Interactive Patient  Education  2018 [employer].  Steps to Quit Smoking Smoking tobacco can be harmful to your health and can affect almost every organ in your body. Smoking puts you, and those around you, at risk for developing many serious chronic diseases. Quitting smoking is difficult, but it is one of the best things that you can do for your health. It is never too late to quit. What are the benefits of quitting smoking? When you quit smoking, you lower your risk of developing serious diseases and conditions, such as:  Lung cancer or lung disease, such as COPD.  Heart disease.  Stroke.  Heart attack.  Infertility.  Osteoporosis and bone fractures.  Additionally, symptoms such as coughing, wheezing, and shortness of breath may get better when you quit. You may also find that you get sick less often because your body is stronger at fighting off colds and infections. If you are pregnant, quitting smoking can help to reduce your chances of having a baby of low birth weight. How do I get ready to quit? When you decide to quit smoking, create a plan to make sure that you are successful. Before you quit:  Pick a date to quit. Set a date within the next two weeks to give you time to prepare.  Write down the reasons why you are quitting. Keep this list in places where you will see it often, such as on your bathroom mirror or in your car or wallet.  Identify the people, places, things, and activities that make you want to smoke (triggers)  and avoid them. Make sure to take these actions: ? Throw away all cigarettes at home, at work, and in your car. ? Throw away smoking accessories, such as Scientist, research (medical). ? Clean your car and make sure to empty the ashtray. ? Clean your home, including curtains and carpets.  Tell your family, friends, and coworkers that you are quitting. Support from your loved ones can make quitting easier.  Talk with your health care provider about your options for quitting  smoking.  Find out what treatment options are covered by your health insurance.  What strategies can I use to quit smoking? Talk with your healthcare provider about different strategies to quit smoking. Some strategies include:  Quitting smoking altogether instead of gradually lessening how much you smoke over a period of time. Research shows that quitting "cold [place]" is more successful than gradually quitting.  Attending in-person counseling to help you build problem-solving skills. You are more likely to have success in quitting if you attend several counseling sessions. Even short sessions of 10 minutes can be effective.  Finding resources and support systems that can help you to quit smoking and remain smoke-free after you quit. These resources are most helpful when you use them often. They can include: ? Online chats with a Social worker. ? Telephone quitlines. ? Careers information officer. ? Support groups or group counseling. ? Text messaging programs. ? Mobile phone applications.  Taking medicines to help you quit smoking. (If you are pregnant or breastfeeding, talk with your health care provider first.) Some medicines contain nicotine and some do not. Both types of medicines help with cravings, but the medicines that include nicotine help to relieve withdrawal symptoms. Your health care provider may recommend: ? Nicotine patches, gum, or lozenges. ? Nicotine inhalers or sprays. ? Non-nicotine medicine that is taken by mouth.  Talk with your health care provider about combining strategies, such as taking medicines while you are also receiving in-person counseling. Using these two strategies together makes you more likely to succeed in quitting than if you used either strategy on its own. If you are pregnant or breastfeeding, talk with your health care provider about finding counseling or other support strategies to quit smoking. Do not take medicine to help you quit smoking unless  told to do so by your health care provider. What things can I do to make it easier to quit? Quitting smoking might feel overwhelming at first, but there is a lot that you can do to make it easier. Take these important actions:  Reach out to your family and friends and ask that they support and encourage you during this time. Call telephone quitlines, reach out to support groups, or work with a counselor for support.  Ask people who smoke to avoid smoking around you.  Avoid places that trigger you to smoke, such as bars, parties, or smoke-break areas at work.  Spend time around people who do not smoke.  Lessen stress in your life, because stress can be a smoking trigger for some people. To lessen stress, try: ? Exercising regularly. ? Deep-breathing exercises. ? Yoga. ? Meditating. ? Performing a body scan. This involves closing your eyes, scanning your body from head to toe, and noticing which parts of your body are particularly tense. Purposefully relax the muscles in those areas.  Download or purchase mobile phone or tablet apps (applications) that can help you stick to your quit plan by providing reminders, tips, and encouragement. There are many free apps, such as  QuitGuide from the CDC [employer] for Disease Control and Prevention). You can find other support for quitting smoking (smoking cessation) through smokefree.gov and other websites.  How will I feel when I quit smoking? Within the first 24 hours of quitting smoking, you may start to feel some withdrawal symptoms. These symptoms are usually most noticeable 2-3 days after quitting, but they usually do not last beyond 2-3 weeks. Changes or symptoms that you might experience include:  Mood swings.  Restlessness, anxiety, or irritation.  Difficulty concentrating.  Dizziness.  Strong cravings for sugary foods in addition to nicotine.  Mild weight gain.  Constipation.  Nausea.  Coughing or a sore throat.  Changes in how  your medicines work in your body.  A depressed mood.  Difficulty sleeping (insomnia).  After the first 2-3 weeks of quitting, you may start to notice more positive results, such as:  Improved sense of smell and taste.  Decreased coughing and sore throat.  Slower heart rate.  Lower blood pressure.  Clearer skin.  The ability to breathe more easily.  Fewer sick days.  Quitting smoking is very challenging for most people. Do not get discouraged if you are not successful the first time. Some people need to make many attempts to quit before they achieve long-term success. Do your best to stick to your quit plan, and talk with your health care provider if you have any questions or concerns. This information is not intended to replace advice given to you by your health care provider. Make sure you discuss any questions you have with your health care provider. Document Released: 12/10/2001 Document Revised: 08/13/2016 Document Reviewed: 05/02/2015 Elsevier Interactive Patient Education  2017 Elsevier Inc.  Cough, Adult A cough helps to clear your throat and lungs. A cough may last only 2-3 weeks (acute), or it may last longer than 8 weeks (chronic). Many different things can cause a cough. A cough may be a sign of an illness or another medical condition. Follow these instructions at home:  Pay attention to any changes in your cough.  Take medicines only as told by your doctor. ? If you were prescribed an antibiotic medicine, take it as told by your doctor. Do not stop taking it even if you start to feel better. ? Talk with your doctor before you try using a cough medicine.  Drink enough fluid to keep your pee (urine) clear or pale yellow.  If the air is dry, use a cold steam vaporizer or humidifier in your home.  Stay away from things that make you cough at work or at home.  If your cough is worse at night, try using extra pillows to raise your head up higher while you  sleep.  Do not smoke, and try not to be around smoke. If you need help quitting, ask your doctor.  Do not have caffeine.  Do not drink alcohol.  Rest as needed. Contact a doctor if:  You have new problems (symptoms).  You cough up yellow fluid (pus).  Your cough does not get better after 2-3 weeks, or your cough gets worse.  Medicine does not help your cough and you are not sleeping well.  You have pain that gets worse or pain that is not helped with medicine.  You have a fever.  You are losing weight and you do not know why.  You have night sweats. Get help right away if:  You cough up blood.  You have trouble breathing.  Your heartbeat is very  fast. This information is not intended to replace advice given to you by your health care provider. Make sure you discuss any questions you have with your health care provider. Document Released: 08/29/2011 Document Revised: 05/23/2016 Document Reviewed: 02/22/2015 Elsevier Interactive Patient Education  [employer].

## 2018-01-05 ENCOUNTER — Telehealth: Payer: Self-pay | Admitting: Family Medicine

## 2018-01-05 MED ORDER — ETODOLAC 500 MG PO TABS: 500.0000 mg | ORAL_TABLET | Freq: Two times a day (BID) | ORAL | 0 refills | Status: DC | PRN

## 2018-01-05 NOTE — Telephone Encounter (Signed)
Pt.notified

## 2018-01-05 NOTE — Telephone Encounter (Signed)
Copied from [place]. Topic: Quick Communication - Rx Refill/Question >> Jan 05, 2018 10:38 AM Oliver Pila B wrote: Pt called to ask for a refill on etodolac (LODINE) 500 MG tablet [[id number]] DISCONTINUED contact pt to advise

## 2018-01-05 NOTE — Telephone Encounter (Signed)
Okay for Rx for just one month However, patient has no labs in our system for a year and a half I don't see a physical We'll want to see her for a CPE and get labs to monitor her kidneys for further prescriptions Thank you

## 2018-01-05 NOTE — Telephone Encounter (Signed)
Pt needs refill on Lodine it is not on current med list.  I called patient and she states only takes when she needs.  She has DDD

## 2018-01-22 ENCOUNTER — Other Ambulatory Visit: Payer: Self-pay | Admitting: Family Medicine

## 2018-01-22 NOTE — Telephone Encounter (Signed)
Pt.notified

## 2018-01-22 NOTE — Telephone Encounter (Signed)
Copied from [place]. Topic: Quick Communication - See Telephone Encounter >> Jan 22, 2018  9:14 AM Hewitt Shorts wrote: CRM for notification. See Telephone encounter for: pt states that dr Doristine Counter originally gave the medication tizanidine na states that she needs to be getting this refilled thru her primary care besst number is   01/22/18.

## 2018-01-22 NOTE — Telephone Encounter (Signed)
Sorry, but Megan Nichols just refilled a two month supply in early December; she should not be out and we'd like her to see him or contact his office for this If he does not want to refill, there is usually a reason (patient needs an appt, not appropriate, no longer needed, etc); I'm not treating her for this, so if he is wishing to turn her care back over to me, she'll need an appt Thank you

## 2018-02-05 ENCOUNTER — Other Ambulatory Visit: Payer: Self-pay | Admitting: Obstetrics and Gynecology

## 2018-02-05 DIAGNOSIS — Z1239 Encounter for other screening for malignant neoplasm of breast: Secondary | ICD-10-CM

## 2018-03-30 ENCOUNTER — Ambulatory Visit
Admission: RE | Admit: 2018-03-30 | Discharge: 2018-03-30 | Disposition: A | Discharge status: Home / Self Care | Payer: Commercial Managed Care - PPO | Source: Ambulatory Visit | Attending: Obstetrics and Gynecology | Admitting: Obstetrics and Gynecology

## 2018-03-30 DIAGNOSIS — Z1239 Encounter for other screening for malignant neoplasm of breast: Secondary | ICD-10-CM

## 2018-03-30 DIAGNOSIS — Z1231 Encounter for screening mammogram for malignant neoplasm of breast: Secondary | ICD-10-CM | POA: Insufficient documentation

## 2018-04-14 ENCOUNTER — Telehealth: Payer: Self-pay | Admitting: Family Medicine

## 2018-04-14 NOTE — Telephone Encounter (Signed)
Copied from [place]. Topic: Quick Communication - See Telephone Encounter >> Apr 14, 2018  2:53 PM Ether Griffins B wrote: CRM for notification. See Telephone encounter for: 04/14/18.  Pt stating she is going to go to the walk in clinic due to me having to send a message to get her an appt with Dr. Sanda Klein. I offered her to be seen tomorrow by Benjamine Mola. She did not want to see anyone but Dr. Sanda Klein for her med refill and right arm pain. She stated she didn't want to wait and she would just go to the walk in and to let Dr. Sanda Klein know that.

## 2018-04-16 ENCOUNTER — Telehealth: Payer: Self-pay

## 2018-04-16 NOTE — Telephone Encounter (Signed)
Copied from [place] [id number]. Topic: Appointment Scheduling - Scheduling Inquiry for Clinic >> Apr 16, 2018  9:21 AM Boyd Kerbs wrote: Reason for CRM: pt. Is wanting to go to the [facility] [place] office and see Dr. Briscoe Deutscher.  She has seen her before.   If patient is wanting to switch PCP then she can call their office and schedule an appointment to establish care. Their number is   [place], [place]: [phone] Behavioral Medicine: [phone] Fax: [phone]

## 2018-05-07 ENCOUNTER — Encounter (INDEPENDENT_AMBULATORY_CARE_PROVIDER_SITE_OTHER): Payer: Self-pay | Admitting: Vascular Surgery

## 2018-05-07 ENCOUNTER — Ambulatory Visit (INDEPENDENT_AMBULATORY_CARE_PROVIDER_SITE_OTHER): Payer: Commercial Managed Care - PPO | Admitting: Vascular Surgery

## 2018-05-07 VITALS — BP 114/76 | HR 76 | Resp 17 | Ht 63.0 in | Wt 195.0 lb

## 2018-05-07 DIAGNOSIS — E785 Hyperlipidemia, unspecified: Secondary | ICD-10-CM | POA: Diagnosis not present

## 2018-05-07 DIAGNOSIS — I872 Venous insufficiency (chronic) (peripheral): Secondary | ICD-10-CM

## 2018-05-07 DIAGNOSIS — M51369 Other intervertebral disc degeneration, lumbar region without mention of lumbar back pain or lower extremity pain: Secondary | ICD-10-CM

## 2018-05-07 DIAGNOSIS — I83813 Varicose veins of bilateral lower extremities with pain: Secondary | ICD-10-CM | POA: Diagnosis not present

## 2018-05-07 DIAGNOSIS — M5136 Other intervertebral disc degeneration, lumbar region: Secondary | ICD-10-CM

## 2018-05-09 ENCOUNTER — Encounter (INDEPENDENT_AMBULATORY_CARE_PROVIDER_SITE_OTHER): Payer: Self-pay | Admitting: Vascular Surgery

## 2018-05-09 DIAGNOSIS — I83813 Varicose veins of bilateral lower extremities with pain: Secondary | ICD-10-CM | POA: Insufficient documentation

## 2018-05-09 DIAGNOSIS — I872 Venous insufficiency (chronic) (peripheral): Secondary | ICD-10-CM | POA: Insufficient documentation

## 2018-05-09 NOTE — Progress Notes (Signed)
MRN : [id number]  Megan Nichols is a 62 y.o. (May 08, 1956) female who presents with chief complaint of  Chief Complaint  Patient presents with  . Follow-up  .  History of Present Illness:   The patient is seen for evaluation of symptomatic varicose veins. The patient relates burning and stinging which worsened steadily throughout the course of the day, particularly with standing. The patient also notes an aching and throbbing pain over the varicosities, particularly with prolonged dependent positions. The symptoms are significantly improved with elevation.  The patient also notes that during hot weather the symptoms are greatly intensified. The patient states the pain from the varicose veins interferes with work, daily exercise, shopping and household maintenance. At this point, the symptoms are persistent and severe enough that they're having a negative impact on lifestyle and are interfering with daily activities.  The patient has worn graduated compression on a daily basis for years. At the present time the patient has also been using over-the-counter analgesics. There is a history of prior laser ablationand multiple rounds of sclerotherapy.  There is no history of DVT, PE or superficial thrombophlebitis. There is no history of ulceration or hemorrhage. The patient denies a significant family history of varicose veins.    Current Meds  Medication Sig  . albuterol (PROVENTIL HFA;VENTOLIN HFA) 108 (90 Base) MCG/ACT inhaler Inhale 1-2 puffs into the lungs every 6 (six) hours as needed for wheezing or shortness of breath.  . carisoprodol (SOMA) 350 MG tablet carisoprodol 350 mg tablet  . etodolac (LODINE) 500 MG tablet Take 1 tablet (500 mg total) by mouth 2 (two) times daily as needed.    Past Medical History:  Diagnosis Date  . Allergy   . Anxiety   . Arthritis   . Degeneration of intervertebral disc of lumbar region   . Depression   . Hyperlipidemia   . Impaired fasting  glucose 05/21/2016  . Irritable bowel syndrome with constipation   . Obesity, Class I, BMI 30.0-34.9 (see actual BMI)   . Pelvic floor dysfunction   . Tobacco use 12/26/2015    Past Surgical History:  Procedure Laterality Date  . APPENDECTOMY    . BLADDER SURGERY    . BREAST CYST ASPIRATION Left    1999  . HEMORRHOID SURGERY    . TONSILLECTOMY      Social History Social History   Tobacco Use  . Smoking status: Current Every Day Smoker    Packs/day: 1.00    Years: 30.00    Pack years: 30.00    Types: Cigarettes  . Smokeless tobacco: Never Used  Substance Use Topics  . Alcohol use: Yes    Alcohol/week: 0.0 oz    Comment: very rare  . Drug use: No    Family History Family History  Problem Relation Age of Onset  . Diabetes Father   . Heart disease Father   . Hypothyroidism Father   . Hypertension Father   . Leukemia Father   . Cancer Father   . Intracerebral hemorrhage Mother   . AAA (abdominal aortic aneurysm) Mother   . Stroke Mother   . Hypothyroidism Sister   . Hypertension Sister   . Stroke Maternal Grandmother   . Heart disease Maternal Grandmother   . Cancer Maternal Grandfather        ?  . Diabetes Paternal Grandmother   . Heart attack Paternal Grandfather   . Hypothyroidism Sister   . Hypertension Sister   . Hypothyroidism Sister   .  Hypertension Sister   . Breast cancer Neg Hx     No Known Allergies   REVIEW OF SYSTEMS (Negative unless checked)  Constitutional: [] Weight loss  [] Fever  [] Chills Cardiac: [] Chest pain   [] Chest pressure   [] Palpitations   [] Shortness of breath when laying flat   [] Shortness of breath with exertion. Vascular:  [] Pain in legs with walking   [x] Pain in legs with standing  [] History of DVT   [] Phlebitis   [x] Swelling in legs   [x] Varicose veins   [] Non-healing ulcers Pulmonary:   [] Uses home oxygen   [] Productive cough   [] Hemoptysis   [] Wheeze  [] COPD   [] Asthma Neurologic:  [] Dizziness   [] Seizures   [] History of  stroke   [] History of TIA  [] Aphasia   [] Vissual changes   [] Weakness or numbness in arm   [] Weakness or numbness in leg Musculoskeletal:   [] Joint swelling   [] Joint pain   [] Low back pain Hematologic:  [] Easy bruising  [] Easy bleeding   [] Hypercoagulable state   [] Anemic Gastrointestinal:  [] Diarrhea   [] Vomiting  [] Gastroesophageal reflux/heartburn   [] Difficulty swallowing. Genitourinary:  [] Chronic kidney disease   [] Difficult urination  [] Frequent urination   [] Blood in urine Skin:  [] Rashes   [] Ulcers  Psychological:  [] History of anxiety   []  History of major depression.  Physical Examination  Vitals:   05/07/18 1537  BP: 114/76  Pulse: 76  Resp: 17  Weight: 195 lb (88.5 kg)  Height: 5\' 3"  (1.6 m)   Body mass index is 34.54 kg/m. Gen: WD/WN, NAD Head: [place]/AT, No temporalis wasting.  Ear/Nose/Throat: Hearing grossly intact, nares w/o erythema or drainage Eyes: PER, EOMI, sclera nonicteric.  Neck: Supple, no large masses.   Pulmonary:  Good air movement, no audible wheezing bilaterally, no use of accessory muscles.  Cardiac: RRR, no JVD Vascular: Large varicosities present extensively greater than 10 mm bilaterally.  Mild venous stasis changes to the legs bilaterally.  2+ soft pitting edema Vessel Right Left  Radial Palpable Palpable  PT Palpable Palpable  DP Palpable Palpable  Gastrointestinal: Non-distended. No guarding/no peritoneal signs.  Musculoskeletal: M/S 5/5 throughout.  No deformity or atrophy.  Neurologic: CN 2-12 intact. Symmetrical.  Speech is fluent. Motor exam as listed above. Psychiatric: Judgment intact, Mood & affect appropriate for pt's clinical situation. Dermatologic: mild venous rashes no ulcers noted.  No changes consistent with cellulitis. Lymph : No lichenification or skin changes of chronic lymphedema.  CBC Lab Results  Component Value Date   WBC 5.8 07/03/2016   HGB 14.9 07/03/2016   HCT 43.3 07/03/2016   MCV 91 07/03/2016   PLT 299  07/03/2016    BMET    Component Value Date/Time   NA 140 07/03/2016 0814   K 4.4 07/03/2016 0814   CL 103 07/03/2016 0814   CO2 23 07/03/2016 0814   GLUCOSE 91 07/03/2016 0814   BUN 15 07/03/2016 0814   CREATININE 0.63 07/03/2016 0814   CALCIUM 9.2 07/03/2016 0814   GFRNONAA 98 07/03/2016 0814   GFRAA 113 07/03/2016 0814   CrCl cannot be calculated (Patient's most recent lab result is older than the maximum 21 days allowed.).  COAG No results found for: INR, PROTIME  Radiology No results found.  Assessment/Plan 1. Varicose veins of both lower extremities with pain  Recommend:  The patient has large symptomatic varicose veins that are painful and associated with swelling.  I have had a long discussion with the patient regarding  varicose veins and why they  cause symptoms.  Patient will begin wearing graduated compression stockings class 1 on a daily basis, beginning first thing in the morning and removing them in the evening. The patient is instructed specifically not to sleep in the stockings.    The patient  will also begin using over-the-counter analgesics such as Motrin 600 mg po TID to help control the symptoms.    In addition, behavioral modification including elevation during the day will be initiated.    Pending the results of these changes the  patient will be reevaluated in 1 month.   An  ultrasound of the venous system will be obtained.   Further plans will be based on the ultrasound results and whether conservative therapies are successful at eliminating the pain and swelling.   2. Chronic venous insufficiency No surgery or intervention at this point in time.    I have had a long discussion with the patient regarding venous insufficiency and why it  causes symptoms. I have discussed with the patient the chronic skin changes that accompany venous insufficiency and the long term sequela such as infection and ulceration.  Patient will begin wearing graduated  compression stockings class 1 (20-30 mmHg) or compression wraps on a daily basis a prescription was given. The patient will put the stockings on first thing in the morning and removing them in the evening. The patient is instructed specifically not to sleep in the stockings.    In addition, behavioral modification including several periods of elevation of the lower extremities during the day will be continued. I have demonstrated that proper elevation is a position with the ankles at heart level.  The patient is instructed to begin routine exercise, especially walking on a daily basis  Patient should undergo duplex ultrasound of the venous system to ensure that DVT or reflux is not present.  Following the review of the ultrasound the patient will follow up in 1 month to reassess the degree of swelling and the control that graduated compression stockings or compression wraps  is offering.     3. Degeneration of intervertebral disc of lumbar region Continue NSAID medications as already ordered, these medications have been reviewed and there are no changes at this time.  Continued activity and therapy was stressed.   4. Hyperlipidemia LDL goal <100 Continue statin as ordered and reviewed, no changes at this time     Hortencia Pilar, MD  05/09/2018 4:16 PM

## 2018-06-10 ENCOUNTER — Other Ambulatory Visit: Payer: Self-pay | Admitting: Family Medicine

## 2018-06-10 NOTE — Telephone Encounter (Signed)
Please confirm with patient if she's taking escitalopram or not Med list says "not taking" on 05/07/2018 I don't want to refill if not taking or she had side effects or allergic reaction

## 2018-06-10 NOTE — Telephone Encounter (Signed)
Left detailed voicemail

## 2018-06-11 NOTE — Telephone Encounter (Signed)
Patient called to say she does not need this prescription.

## 2018-06-15 ENCOUNTER — Ambulatory Visit (INDEPENDENT_AMBULATORY_CARE_PROVIDER_SITE_OTHER): Payer: Commercial Managed Care - PPO | Admitting: Vascular Surgery

## 2018-06-15 ENCOUNTER — Ambulatory Visit (INDEPENDENT_AMBULATORY_CARE_PROVIDER_SITE_OTHER): Payer: Commercial Managed Care - PPO

## 2018-06-15 ENCOUNTER — Encounter (INDEPENDENT_AMBULATORY_CARE_PROVIDER_SITE_OTHER): Payer: Self-pay | Admitting: Vascular Surgery

## 2018-06-15 VITALS — BP 118/77 | HR 60 | Resp 17 | Ht 63.0 in | Wt 196.0 lb

## 2018-06-15 DIAGNOSIS — I83812 Varicose veins of left lower extremities with pain: Secondary | ICD-10-CM | POA: Diagnosis not present

## 2018-06-15 DIAGNOSIS — E785 Hyperlipidemia, unspecified: Secondary | ICD-10-CM | POA: Diagnosis not present

## 2018-06-15 DIAGNOSIS — I872 Venous insufficiency (chronic) (peripheral): Secondary | ICD-10-CM | POA: Diagnosis not present

## 2018-06-15 DIAGNOSIS — I83813 Varicose veins of bilateral lower extremities with pain: Secondary | ICD-10-CM | POA: Diagnosis not present

## 2018-06-15 DIAGNOSIS — I83811 Varicose veins of right lower extremities with pain: Secondary | ICD-10-CM | POA: Diagnosis not present

## 2018-06-15 NOTE — Progress Notes (Signed)
MRN : [id number]  Megan Nichols is a 62 y.o. (1956/11/19) female who presents with chief complaint of  Chief Complaint  Patient presents with  . Follow-up    pt conv bil ven reflux  .  History of Present Illness:   The patient returns for followup evaluation regarding painful varicose veins of the right leg. The patient continues to have pain in the lower extremities with dependency. The pain is lessened with elevation. Graduated compression stockings, Class I (20-30 mmHg), have been worn but the stockings do not eliminate the leg pain. Over-the-counter analgesics do not improve the symptoms. The degree of discomfort continues to interfere with daily activities. The patient notes the pain in the legs is causing problems with daily exercise, at the workplace and even with household activities and maintenance such as standing in the kitchen preparing meals and doing dishes.   Venous ultrasound shows reflux within the deep venous system, no evidence of acute or chronic DVT.  Superficial reflux is present in the saphenofemoral junction and the anterior saphenous vein  Current Meds  Medication Sig  . acetaminophen (TYLENOL) 500 MG tablet Take 500 mg by mouth every 8 (eight) hours as needed.  Marland Kitchen albuterol (PROVENTIL HFA;VENTOLIN HFA) 108 (90 Base) MCG/ACT inhaler Inhale 1-2 puffs into the lungs every 6 (six) hours as needed for wheezing or shortness of breath.  . carisoprodol (SOMA) 350 MG tablet carisoprodol 350 mg tablet  . cholecalciferol (VITAMIN D) 1000 units tablet Take 1,000 Units by mouth daily.  Marland Kitchen etodolac (LODINE) 500 MG tablet Take 1 tablet (500 mg total) by mouth 2 (two) times daily as needed.  Marland Kitchen tiZANidine (ZANAFLEX) 4 MG tablet Take 4 mg by mouth daily as needed.     Past Medical History:  Diagnosis Date  . Allergy   . Anxiety   . Arthritis   . Degeneration of intervertebral disc of lumbar region   . Depression   . Hyperlipidemia   . Impaired fasting glucose  05/21/2016  . Irritable bowel syndrome with constipation   . Obesity, Class I, BMI 30.0-34.9 (see actual BMI)   . Pelvic floor dysfunction   . Tobacco use 12/26/2015    Past Surgical History:  Procedure Laterality Date  . APPENDECTOMY    . BLADDER SURGERY    . BREAST CYST ASPIRATION Left    1999  . HEMORRHOID SURGERY    . TONSILLECTOMY      Social History Social History   Tobacco Use  . Smoking status: Current Every Day Smoker    Packs/day: 1.00    Years: 30.00    Pack years: 30.00    Types: Cigarettes  . Smokeless tobacco: Never Used  Substance Use Topics  . Alcohol use: Yes    Alcohol/week: 0.0 oz    Comment: very rare  . Drug use: No    Family History Family History  Problem Relation Age of Onset  . Diabetes Father   . Heart disease Father   . Hypothyroidism Father   . Hypertension Father   . Leukemia Father   . Cancer Father   . Intracerebral hemorrhage Mother   . AAA (abdominal aortic aneurysm) Mother   . Stroke Mother   . Hypothyroidism Sister   . Hypertension Sister   . Stroke Maternal Grandmother   . Heart disease Maternal Grandmother   . Cancer Maternal Grandfather        ?  . Diabetes Paternal Grandmother   . Heart attack Paternal Grandfather   .  Hypothyroidism Sister   . Hypertension Sister   . Hypothyroidism Sister   . Hypertension Sister   . Breast cancer Neg Hx     No Known Allergies   REVIEW OF SYSTEMS (Negative unless checked)  Constitutional: [] Weight loss  [] Fever  [] Chills Cardiac: [] Chest pain   [] Chest pressure   [] Palpitations   [] Shortness of breath when laying flat   [] Shortness of breath with exertion. Vascular:  [] Pain in legs with walking   [x] Pain in legs at rest  [] History of DVT   [] Phlebitis   [x] Swelling in legs   [] Varicose veins   [] Non-healing ulcers Pulmonary:   [] Uses home oxygen   [] Productive cough   [] Hemoptysis   [] Wheeze  [] COPD   [] Asthma Neurologic:  [] Dizziness   [] Seizures   [] History of stroke    [] History of TIA  [] Aphasia   [] Vissual changes   [] Weakness or numbness in arm   [] Weakness or numbness in leg Musculoskeletal:   [] Joint swelling   [] Joint pain   [] Low back pain Hematologic:  [] Easy bruising  [] Easy bleeding   [] Hypercoagulable state   [] Anemic Gastrointestinal:  [] Diarrhea   [] Vomiting  [] Gastroesophageal reflux/heartburn   [] Difficulty swallowing. Genitourinary:  [] Chronic kidney disease   [] Difficult urination  [] Frequent urination   [] Blood in urine Skin:  [] Rashes   [] Ulcers  Psychological:  [] History of anxiety   []  History of major depression.  Physical Examination  Vitals:   06/15/18 1546  BP: 118/77  Pulse: 60  Resp: 17  Weight: 196 lb (88.9 kg)  Height: 5\' 3"  (1.6 m)   Body mass index is 34.72 kg/m. Gen: WD/WN, NAD Head: [place]/AT, No temporalis wasting.  Ear/Nose/Throat: Hearing grossly intact, nares w/o erythema or drainage Eyes: PER, EOMI, sclera nonicteric.  Neck: Supple, no large masses.   Pulmonary:  Good air movement, no audible wheezing bilaterally, no use of accessory muscles.  Cardiac: RRR, no JVD Vascular:Large varicosities present extensively greater than 10 mm right leg.  Mild venous stasis changes to the legs bilaterally.  2+ soft pitting edema Vessel Right Left  Radial Palpable Palpable  PT Palpable Palpable  DP Palpable Palpable  Gastrointestinal: Non-distended. No guarding/no peritoneal signs.  Musculoskeletal: M/S 5/5 throughout.  No deformity or atrophy.  Neurologic: CN 2-12 intact. Symmetrical.  Speech is fluent. Motor exam as listed above. Psychiatric: Judgment intact, Mood & affect appropriate for pt's clinical situation. Dermatologic: mild venous rashes or ulcers noted.  No changes consistent with cellulitis. Lymph : No lichenification or skin changes of chronic lymphedema.  CBC Lab Results  Component Value Date   WBC 5.8 07/03/2016   HGB 14.9 07/03/2016   HCT 43.3 07/03/2016   MCV 91 07/03/2016   PLT 299 07/03/2016     BMET    Component Value Date/Time   NA 140 07/03/2016 0814   K 4.4 07/03/2016 0814   CL 103 07/03/2016 0814   CO2 23 07/03/2016 0814   GLUCOSE 91 07/03/2016 0814   BUN 15 07/03/2016 0814   CREATININE 0.63 07/03/2016 0814   CALCIUM 9.2 07/03/2016 0814   GFRNONAA 98 07/03/2016 0814   GFRAA 113 07/03/2016 0814   CrCl cannot be calculated (Patient's most recent lab result is older than the maximum 21 days allowed.).  COAG No results found for: INR, PROTIME  Radiology No results found.   Assessment/Plan 1. Varicose veins of both lower extremities with pain Recommend:  The patient has had successful ablation of the previously incompetent saphenous venous system but still has  persistent symptoms of pain and swelling that are having a negative impact on daily life and daily activities.  Duplex ultrasound demonstrates reflux within the deep venous system, no evidence of acute or chronic DVT.  Superficial reflux is present in the saphenofemoral junction and the anterior saphenous vein  Patient should undergo injection sclerotherapy to treat the residual varicosities.  The risks, benefits and alternative therapies were reviewed in detail with the patient.  All questions were answered.  The patient agrees to proceed with sclerotherapy at their convenience.  The patient will continue wearing the graduated compression stockings and using the over-the-counter pain medications to treat her symptoms.     2. Chronic venous insufficiency See #1  3. Hyperlipidemia LDL goal <100 Continue statin as ordered and reviewed, no changes at this time     Hortencia Pilar, MD  06/15/2018 9:04 PM

## 2018-07-23 ENCOUNTER — Ambulatory Visit (INDEPENDENT_AMBULATORY_CARE_PROVIDER_SITE_OTHER): Payer: Commercial Managed Care - PPO | Admitting: Vascular Surgery

## 2018-07-23 ENCOUNTER — Encounter (INDEPENDENT_AMBULATORY_CARE_PROVIDER_SITE_OTHER): Payer: Self-pay | Admitting: Vascular Surgery

## 2018-07-23 VITALS — BP 109/72 | HR 75 | Resp 16 | Ht 63.0 in | Wt 193.2 lb

## 2018-07-23 DIAGNOSIS — I83811 Varicose veins of right lower extremities with pain: Secondary | ICD-10-CM

## 2018-07-23 DIAGNOSIS — I83813 Varicose veins of bilateral lower extremities with pain: Secondary | ICD-10-CM

## 2018-07-26 ENCOUNTER — Encounter (INDEPENDENT_AMBULATORY_CARE_PROVIDER_SITE_OTHER): Payer: Self-pay | Admitting: Vascular Surgery

## 2018-07-26 NOTE — Progress Notes (Signed)
Megan Nichols is a 62 y.o.female who presents with painful varicose veins of the right leg  Past Medical History:  Diagnosis Date  . Allergy   . Anxiety   . Arthritis   . Degeneration of intervertebral disc of lumbar region   . Depression   . Hyperlipidemia   . Impaired fasting glucose 05/21/2016  . Irritable bowel syndrome with constipation   . Obesity, Class I, BMI 30.0-34.9 (see actual BMI)   . Pelvic floor dysfunction   . Tobacco use 12/26/2015    Past Surgical History:  Procedure Laterality Date  . APPENDECTOMY    . BLADDER SURGERY    . BREAST CYST ASPIRATION Left    1999  . HEMORRHOID SURGERY    . TONSILLECTOMY      Current Outpatient Medications  Medication Sig Dispense Refill  . acetaminophen (TYLENOL) 500 MG tablet Take 500 mg by mouth every 8 (eight) hours as needed.    Marland Kitchen albuterol (PROVENTIL HFA;VENTOLIN HFA) 108 (90 Base) MCG/ACT inhaler Inhale 1-2 puffs into the lungs every 6 (six) hours as needed for wheezing or shortness of breath. 1 Inhaler 0  . carisoprodol (SOMA) 350 MG tablet carisoprodol 350 mg tablet    . cholecalciferol (VITAMIN D) 1000 units tablet Take 1,000 Units by mouth daily.    Marland Kitchen etodolac (LODINE) 500 MG tablet Take 1 tablet (500 mg total) by mouth 2 (two) times daily as needed. 40 tablet 0  . tiZANidine (ZANAFLEX) 4 MG tablet Take 4 mg by mouth daily as needed.     . Azelastine-Fluticasone 137-50 MCG/ACT SUSP Place 2 sprays into the nose daily. (Patient not taking: Reported on 05/07/2018) 23 g 3  . benzonatate (TESSALON PERLES) 100 MG capsule Take 1-2 capsules (100-200 mg total) by mouth 3 (three) times daily as needed. (Patient not taking: Reported on 05/07/2018) 60 capsule 0  . escitalopram (LEXAPRO) 10 MG tablet Take 1 tablet (10 mg total) by mouth daily. (Patient not taking: Reported on 05/07/2018) 90 tablet 1   No current facility-administered medications for this visit.     No Known Allergies  Indication: Patient presents with symptomatic  varicose veins of the right lower extremity.  Procedure: Foam sclerotherapy was performed on the right lower extremity. Using ultrasound guidance, 5 mL of foam Sotradecol was used to inject the varicosities of the right lower extremity. Compression wraps were placed. The patient tolerated the procedure well.

## 2018-07-28 ENCOUNTER — Encounter (INDEPENDENT_AMBULATORY_CARE_PROVIDER_SITE_OTHER): Payer: Commercial Managed Care - PPO

## 2018-08-25 ENCOUNTER — Telehealth: Payer: Self-pay | Admitting: Family Medicine

## 2018-08-25 DIAGNOSIS — M5090 Cervical disc disorder, unspecified, unspecified cervical region: Secondary | ICD-10-CM

## 2018-08-25 DIAGNOSIS — M5136 Other intervertebral disc degeneration, lumbar region: Secondary | ICD-10-CM

## 2018-08-25 NOTE — Telephone Encounter (Signed)
Copied from [place]. Topic: Quick Communication - See Telephone Encounter >> Aug 25, 2018  3:15 PM Neva Seat wrote: Pt needing a referral from Dr. Sanda Klein to [facility]. - Dr. Windell Moment.  They will need pt's records for back and neck issues.

## 2018-08-26 SURGERY — Surgical Case
Anesthesia: *Unknown

## 2018-09-17 NOTE — H&P (Signed)
Megan Nichols is a 62 y.o. female here for posterior colporrhaphy and possible anterior colporrhaphy .  She is s/p a TVT for SUI 2005 . No c/o of incontinence . Does have some issues with fully emptying. She has tissues falling from vagina. + tobacco use   Not sexually active  Past Medical History:  has a past medical history of Acid reflux disease, Allergic state, Benign breast lumps, Cervical disc disease, Depression, Hemorrhoids, Hyperlipidemia, Menstrual disorder, unspecified, and Urinary incontinence.  Past Surgical History:  has a past surgical history that includes Hemorrhoidectomy By Simple Ligation; Colporrhaphy For Repair Cystocele Anterior; Appendectomy (1962); Tonsillectomy (1966); Tubal ligation; and Bladder surgery. Family History: family history includes Diabetes type II in her father; Epilepsy in her other; Leukemia in her father; Myocardial Infarction (Heart attack) (age of onset: 64) in her father; Osteoporosis (Thinning of bones) in her mother; Prostate cancer in her father; Stroke in her mother; Uterine cancer in her mother. Social History:  reports that she has been smoking cigarettes.  She has been smoking about 1.00 pack per day. She has quit using smokeless tobacco. She reports that she drinks alcohol. She reports that she does not use drugs. OB/GYN History:          OB History    Gravida  2   Para  2   Term      Preterm      AB      Living  2     SAB      TAB      Ectopic      Molar      Multiple      Live Births  2          Allergies: has No Known Allergies. Medications:  Current Outpatient Medications:  .  acetaminophen (TYLENOL) 500 MG tablet, Take by mouth, Disp: , Rfl:  .  albuterol (PROVENTIL HFA) 90 mcg/actuation inhaler, Inhale into the lungs, Disp: , Rfl:  .  carisoprodol (SOMA) 350 MG tablet, carisoprodol 350 mg tablet, Disp: , Rfl:  .  cholecalciferol (CHOLECALCIFEROL) 1,000 unit tablet, Take by mouth, Disp: , Rfl:  .   etodolac (LODINE) 500 MG tablet, Take by mouth, Disp: , Rfl:  .  etodolac (LODINE) 500 MG tablet, Take 1 tablet (500 mg total) by mouth 2 (two) times daily as needed Take with food. (Patient not taking: Reported on 06/12/2018 ), Disp: 60 tablet, Rfl: 2 .  FLUARIX QUAD 2017-2018, PF, 60 mcg (15 mcg x 4)/0.5 mL IM syringe, TO BE ADMINISTERED BY PHARMACIST FOR IMMUNIZATION, Disp: , Rfl: 0 .  HYDROcodone-acetaminophen (NORCO) 5-325 mg tablet, Take 1 tablet by mouth every 6 (six) hours as needed (Patient not taking: Reported on 08/26/2018 ), Disp: 12 tablet, Rfl: 0  Review of Systems: General:                      No fatigue or weight loss Eyes:                           No vision changes Ears:                            No hearing difficulty Respiratory:                No cough or shortness of breath Pulmonary:  No asthma or shortness of breath Cardiovascular:           No chest pain, palpitations, dyspnea on exertion Gastrointestinal:          No abdominal bloating, chronic diarrhea, constipations, masses, pain or hematochezia Genitourinary:             No hematuria, dysuria, abnormal vaginal discharge, pelvic pain, Menometrorrhagia, + tissue falling from vagina  Lymphatic:                   No swollen lymph nodes Musculoskeletal:         No muscle weakness Neurologic:                  No extremity weakness, syncope, seizure disorder Psychiatric:                  No history of depression, delusions or suicidal/homicidal ideation    Exam:      Vitals:   09/16/18 1449  BP: 119/82  Pulse: 73    Body mass index is 34.72 kg/m.  WDWN white/  female in NAD   Lungs: CTA  CV : RRR without murmur   Breast: exam done in sitting and lying position : No dimpling or retraction, no dominant mass, no spontaneous discharge, no axillary adenopathy Neck:  no thyromegaly Abdomen: soft , no mass, normal active bowel sounds,  non-tender, no rebound tenderness Pelvic: tanner stage 5  ,  External genitalia: vulva /labia no lesions Urethra: no prolapse Vagina: normal physiologic d/c1-2 cystocele , 3 degree rectocele  Cervix: no lesions, no cervical motion tenderness   Uterus: normal size shape and contour, non-tender Adnexa: no mass,  non-tender   Rectovaginal:   Impression:   The primary encounter diagnosis was Rectocele, female. A diagnosis of Cystocele, midline was also pertinent to this visit.    Plan:  Pt has elected for posterior colporrhaphy , possible anterior if under anesthesia it looks more pronounced .  Risks of the procedure discussed with her .      Return if symptoms worsen or fail to improve, for pre op .  Caroline Sauger, MD

## 2018-09-21 NOTE — H&P (Signed)
Megan Nichols is a 62 y.o. female here for posterior colporrhaphy , possible anterior. Pt here for evaluation for POP . She is s/p a TVT for SUI 2005 . No c/o of incontinence . Does have some issues with fully emptying. She has tissues falling from vagina. + tobacco use   Not sexually active  Past Medical History:  has a past medical history of Acid reflux disease, Allergic state, Benign breast lumps, Cervical disc disease, Depression, Hemorrhoids, Hyperlipidemia, Menstrual disorder, unspecified, and Urinary incontinence.  Past Surgical History:  has a past surgical history that includes Hemorrhoidectomy By Simple Ligation; Colporrhaphy For Repair Cystocele Anterior; Appendectomy (1962); Tonsillectomy (1966); Tubal ligation; and Bladder surgery. Family History: family history includes Diabetes type II in her father; Epilepsy in her other; Leukemia in her father; Myocardial Infarction (Heart attack) (age of onset: 31) in her father; Osteoporosis (Thinning of bones) in her mother; Prostate cancer in her father; Stroke in her mother; Uterine cancer in her mother. Social History:  reports that she has been smoking cigarettes.  She has been smoking about 1.00 pack per day. She has quit using smokeless tobacco. She reports that she drinks alcohol. She reports that she does not use drugs. OB/GYN History:          OB History    Gravida  2   Para  2   Term      Preterm      AB      Living  2     SAB      TAB      Ectopic      Molar      Multiple      Live Births  2          Allergies: has No Known Allergies. Medications:  Current Outpatient Medications:  .  acetaminophen (TYLENOL) 500 MG tablet, Take by mouth, Disp: , Rfl:  .  albuterol (PROVENTIL HFA) 90 mcg/actuation inhaler, Inhale into the lungs, Disp: , Rfl:  .  carisoprodol (SOMA) 350 MG tablet, carisoprodol 350 mg tablet, Disp: , Rfl:  .  cholecalciferol (CHOLECALCIFEROL) 1,000 unit tablet, Take by mouth, Disp: ,  Rfl:  .  etodolac (LODINE) 500 MG tablet, Take by mouth, Disp: , Rfl:  .  etodolac (LODINE) 500 MG tablet, Take 1 tablet (500 mg total) by mouth 2 (two) times daily as needed Take with food. (Patient not taking: Reported on 06/12/2018 ), Disp: 60 tablet, Rfl: 2 .  FLUARIX QUAD 2017-2018, PF, 60 mcg (15 mcg x 4)/0.5 mL IM syringe, TO BE ADMINISTERED BY PHARMACIST FOR IMMUNIZATION, Disp: , Rfl: 0 .  HYDROcodone-acetaminophen (NORCO) 5-325 mg tablet, Take 1 tablet by mouth every 6 (six) hours as needed (Patient not taking: Reported on 08/26/2018 ), Disp: 12 tablet, Rfl: 0  Review of Systems: General:                      No fatigue or weight loss Eyes:                           No vision changes Ears:                            No hearing difficulty Respiratory:                No cough or shortness of breath Pulmonary:  No asthma or shortness of breath Cardiovascular:           No chest pain, palpitations, dyspnea on exertion Gastrointestinal:          No abdominal bloating, chronic diarrhea, constipations, masses, pain or hematochezia Genitourinary:             No hematuria, dysuria, abnormal vaginal discharge, pelvic pain, Menometrorrhagia, + tissue falling from vagina  Lymphatic:                   No swollen lymph nodes Musculoskeletal:         No muscle weakness Neurologic:                  No extremity weakness, syncope, seizure disorder Psychiatric:                  No history of depression, delusions or suicidal/homicidal ideation    Exam:      Vitals:   09/21/18 1449  BP: 119/82  Pulse: 73    Body mass index is 34.72 kg/m.  WDWN white/  female in NAD   Lungs: CTA  CV : RRR without murmur   Breast: exam done in sitting and lying position : No dimpling or retraction, no dominant mass, no spontaneous discharge, no axillary adenopathy Neck:  no thyromegaly Abdomen: soft , no mass, normal active bowel sounds,  non-tender, no rebound tenderness Pelvic:  tanner stage 5 ,  External genitalia: vulva /labia no lesions Urethra: no prolapse Vagina: normal physiologic d/c1-2 cystocele , 3 degree rectocele  Cervix: no lesions, no cervical motion tenderness   Uterus: normal size shape and contour, non-tender Adnexa: no mass,  non-tender   Rectovaginal:   Impression:   The primary encounter diagnosis was Rectocele, female. A diagnosis of Cystocele, midline was also pertinent to this visit.    Plan:  Pt has elected for posterior colporrhaphy , possible anterior if under anesthesia it looks more pronounced .  Risks of the procedure discussed with her .      Marland Kitchen  Caroline Sauger, MD

## 2018-10-01 ENCOUNTER — Encounter
Admission: RE | Admit: 2018-10-01 | Discharge: 2018-10-01 | Disposition: A | Discharge status: Home / Self Care | Payer: Commercial Managed Care - PPO | Source: Ambulatory Visit | Attending: Obstetrics and Gynecology | Admitting: Obstetrics and Gynecology

## 2018-10-01 ENCOUNTER — Other Ambulatory Visit: Payer: Self-pay

## 2018-10-01 HISTORY — DX: Malignant (primary) neoplasm, unspecified: C80.1

## 2018-10-01 NOTE — Patient Instructions (Signed)
Your procedure is scheduled on: 10-09-18 FRIDAY Report to Same Day Surgery 2nd floor medical mall [facility] Entrance-take elevator on left to 2nd floor.  Check in with surgery information desk.) To find out your arrival time please call [phone] between 1PM - 3PM on 10-08-18 THURSDAY  Remember: Instructions that are not followed completely may result in serious medical risk, up to and including death, or upon the discretion of your surgeon and anesthesiologist your surgery may need to be rescheduled.    _x___ 1. Do not eat food after midnight the night before your procedure. NO GUM OR CANDY AFTER MIDNIGHT.  You may drink clear liquids up to 2 hours before you are scheduled to arrive at the hospital for your procedure.  Do not drink clear liquids within 2 hours of your scheduled arrival to the hospital.  Clear liquids include  --Water or Apple juice without pulp  --Clear carbohydrate beverage such as ClearFast or Gatorade  --Black Coffee or Clear Tea (No milk, no creamers, do not add anything to the coffee or Tea   ____Ensure clear carbohydrate drink on the way to the hospital for bariatric patients  ____Ensure clear carbohydrate drink 3 hours before surgery for Dr Dwyane Luo patients if physician instructed.     __x__ 2. No Alcohol for 24 hours before or after surgery.   __x__3. No Smoking or e-cigarettes for 24 prior to surgery.  Do not use any chewable tobacco products for at least 6 hour prior to surgery   ____  4. Bring all medications with you on the day of surgery if instructed.    __x__ 5. Notify your doctor if there is any change in your medical condition     (cold, fever, infections).    x___6. On the morning of surgery brush your teeth with toothpaste and water.  You may rinse your mouth with mouth wash if you wish.  Do not swallow any toothpaste or mouthwash.   Do not wear jewelry, make-up, hairpins, clips or nail polish.  Do not wear lotions, powders, or perfumes.  You may wear deodorant.  Do not shave 48 hours prior to surgery. Men may shave face and neck.  Do not bring valuables to the hospital.    [facility] is not responsible for any belongings or valuables.               Contacts, dentures or bridgework may not be worn into surgery.  Leave your suitcase in the car. After surgery it may be brought to your room.  For patients admitted to the hospital, discharge time is determined by your treatment team.  _  Patients discharged the day of surgery will not be allowed to drive home.  You will need someone to drive you home and stay with you the night of your procedure.    Please read over the following fact sheets that you were given:   [facility] Preparing for Surgery   ____ Take anti-hypertensive listed below, cardiac, seizure, asthma, anti-reflux and psychiatric medicines. These include:  1. NONE  2.  3.  4.  5.  6.  _X___Fleets enema as directed-DO FLEET ENEMA DAY OF SURGERY AT HOME 1 HOUR PRIOR TO ARRIVAL TIME TO HOSPITAL  ____ Use CHG Soap or sage wipes as directed on instruction sheet   ____ Use inhalers on the day of surgery and bring to hospital day of surgery  ____ Stop Metformin and Janumet 2 days prior to surgery.    ____ Take  1/2 of usual insulin dose the night before surgery and none on the morning surgery.   ____ Follow recommendations from Cardiologist, Pulmonologist or PCP regarding stopping Aspirin, Coumadin, Plavix ,Eliquis, Effient, or Pradaxa, and Pletal.  X____Stop Anti-inflammatories such as Advil, Aleve, Ibuprofen, Motrin, Naproxen, Naprosyn, Goodies powders or aspirin products NOW-OK to take Tylenol    _x___ Stop supplements until after surgery-STOP AIRBORNE NOW-MAY RESUME AFTER SURGERY   ____ Bring C-Pap to the hospital.

## 2018-10-05 ENCOUNTER — Encounter
Admission: RE | Admit: 2018-10-05 | Discharge: 2018-10-05 | Disposition: A | Discharge status: Home / Self Care | Payer: Commercial Managed Care - PPO | Source: Ambulatory Visit | Attending: Obstetrics and Gynecology | Admitting: Obstetrics and Gynecology

## 2018-10-05 DIAGNOSIS — N8111 Cystocele, midline: Secondary | ICD-10-CM | POA: Diagnosis not present

## 2018-10-05 DIAGNOSIS — N816 Rectocele: Secondary | ICD-10-CM | POA: Diagnosis not present

## 2018-10-05 DIAGNOSIS — Z01818 Encounter for other preprocedural examination: Secondary | ICD-10-CM | POA: Insufficient documentation

## 2018-10-05 LAB — TYPE AND SCREEN
ABO/RH(D): O POS
Antibody Screen: NEGATIVE

## 2018-10-05 LAB — BASIC METABOLIC PANEL
Anion gap: 4 — ABNORMAL LOW (ref 5–15)
BUN: 10 mg/dL (ref 8–23)
CALCIUM: 9.2 mg/dL (ref 8.9–10.3)
CHLORIDE: 106 mmol/L (ref 98–111)
CO2: 30 mmol/L (ref 22–32)
CREATININE: 0.49 mg/dL (ref 0.44–1.00)
GFR calc Af Amer: >60 mL/min (ref >60)
GFR calc non Af Amer: >60 mL/min (ref >60)
Glucose, Bld: 95 mg/dL (ref 70–99)
Potassium: 3.9 mmol/L (ref 3.5–5.1)
SODIUM: 140 mmol/L (ref 135–145)

## 2018-10-05 LAB — CBC
HCT: 47.1 % — ABNORMAL HIGH (ref 35.0–47.0)
Hemoglobin: 15.8 g/dL (ref 12.0–16.0)
MCH: 31.4 pg (ref 26.0–34.0)
MCHC: 33.6 g/dL (ref 32.0–36.0)
MCV: 93.6 fL (ref 80.0–100.0)
PLATELETS: 262 10*3/uL (ref 150–440)
RBC: 5.03 MIL/uL (ref 3.80–5.20)
RDW: 13.5 % (ref 11.5–14.5)
WBC: 5.8 10*3/uL (ref 3.6–11.0)

## 2018-10-09 ENCOUNTER — Ambulatory Visit: Payer: Commercial Managed Care - PPO | Admitting: Certified Registered Nurse Anesthetist

## 2018-10-09 ENCOUNTER — Encounter: Payer: Self-pay | Admitting: *Deleted

## 2018-10-09 ENCOUNTER — Encounter
Admission: RE | Disposition: A | Discharge status: Home / Self Care | Payer: Self-pay | Source: Ambulatory Visit | Attending: Obstetrics and Gynecology

## 2018-10-09 ENCOUNTER — Other Ambulatory Visit: Payer: Self-pay

## 2018-10-09 ENCOUNTER — Observation Stay
Admission: RE | Admit: 2018-10-09 | Discharge: 2018-10-09 | Disposition: A | Discharge status: Home / Self Care | Payer: Commercial Managed Care - PPO | Source: Ambulatory Visit | Attending: Obstetrics and Gynecology | Admitting: Obstetrics and Gynecology

## 2018-10-09 DIAGNOSIS — N816 Rectocele: Secondary | ICD-10-CM | POA: Diagnosis not present

## 2018-10-09 DIAGNOSIS — E785 Hyperlipidemia, unspecified: Secondary | ICD-10-CM | POA: Diagnosis not present

## 2018-10-09 DIAGNOSIS — F17210 Nicotine dependence, cigarettes, uncomplicated: Secondary | ICD-10-CM | POA: Diagnosis not present

## 2018-10-09 DIAGNOSIS — Z79899 Other long term (current) drug therapy: Secondary | ICD-10-CM | POA: Diagnosis not present

## 2018-10-09 DIAGNOSIS — K219 Gastro-esophageal reflux disease without esophagitis: Secondary | ICD-10-CM | POA: Insufficient documentation

## 2018-10-09 DIAGNOSIS — F329 Major depressive disorder, single episode, unspecified: Secondary | ICD-10-CM | POA: Insufficient documentation

## 2018-10-09 DIAGNOSIS — Z9889 Other specified postprocedural states: Secondary | ICD-10-CM

## 2018-10-09 HISTORY — PX: RECTOCELE REPAIR: SHX761

## 2018-10-09 LAB — ABO/RH: ABO/RH(D): O POS

## 2018-10-09 SURGERY — COLPORRHAPHY, POSTERIOR, FOR RECTOCELE REPAIR
Anesthesia: General

## 2018-10-09 MED ORDER — PROPOFOL 10 MG/ML IV BOLUS
INTRAVENOUS | Status: AC
  Filled 2018-10-09: qty 20

## 2018-10-09 MED ORDER — ONDANSETRON HCL 4 MG/2ML IJ SOLN: 4.0000 mg | Freq: Four times a day (QID) | INTRAMUSCULAR | Status: DC | PRN

## 2018-10-09 MED ORDER — DEXAMETHASONE SODIUM PHOSPHATE 10 MG/ML IJ SOLN
INTRAMUSCULAR | Status: DC | PRN
  Administered 2018-10-09: 10 mg via INTRAVENOUS

## 2018-10-09 MED ORDER — LACTATED RINGERS IV SOLN: INTRAVENOUS | Status: DC

## 2018-10-09 MED ORDER — FENTANYL CITRATE (PF) 100 MCG/2ML IJ SOLN: 25.0000 ug | INTRAMUSCULAR | Status: DC | PRN

## 2018-10-09 MED ORDER — FAMOTIDINE 20 MG PO TABS
ORAL_TABLET | ORAL | Status: AC
  Administered 2018-10-09: 20 mg via ORAL
  Filled 2018-10-09: qty 1

## 2018-10-09 MED ORDER — FENTANYL CITRATE (PF) 250 MCG/5ML IJ SOLN
INTRAMUSCULAR | Status: AC
  Filled 2018-10-09: qty 5

## 2018-10-09 MED ORDER — FENTANYL CITRATE (PF) 100 MCG/2ML IJ SOLN
INTRAMUSCULAR | Status: DC | PRN
  Administered 2018-10-09 (×4): 25 ug via INTRAVENOUS

## 2018-10-09 MED ORDER — ACETAMINOPHEN 10 MG/ML IV SOLN
INTRAVENOUS | Status: AC
  Filled 2018-10-09: qty 100

## 2018-10-09 MED ORDER — ONDANSETRON HCL 4 MG PO TABS: 4.0000 mg | ORAL_TABLET | Freq: Four times a day (QID) | ORAL | Status: DC | PRN

## 2018-10-09 MED ORDER — ESTROGENS, CONJUGATED 0.625 MG/GM VA CREA
TOPICAL_CREAM | VAGINAL | Status: AC
  Filled 2018-10-09: qty 30

## 2018-10-09 MED ORDER — LIDOCAINE-EPINEPHRINE 1 %-1:100000 IJ SOLN
INTRAMUSCULAR | Status: DC | PRN
  Administered 2018-10-09: 8 mL

## 2018-10-09 MED ORDER — ONDANSETRON HCL 4 MG/2ML IJ SOLN
INTRAMUSCULAR | Status: DC | PRN
  Administered 2018-10-09: 4 mg via INTRAVENOUS

## 2018-10-09 MED ORDER — ONDANSETRON HCL 4 MG PO TABS: 4.0000 mg | ORAL_TABLET | Freq: Four times a day (QID) | ORAL | 0 refills | Status: DC | PRN

## 2018-10-09 MED ORDER — PROPOFOL 10 MG/ML IV BOLUS
INTRAVENOUS | Status: DC | PRN
  Administered 2018-10-09: 150 mg via INTRAVENOUS

## 2018-10-09 MED ORDER — LIDOCAINE HCL (PF) 2 % IJ SOLN
INTRAMUSCULAR | Status: AC
  Filled 2018-10-09: qty 10

## 2018-10-09 MED ORDER — KETOROLAC TROMETHAMINE 30 MG/ML IJ SOLN
INTRAMUSCULAR | Status: DC | PRN
  Administered 2018-10-09: 30 mg via INTRAVENOUS

## 2018-10-09 MED ORDER — MORPHINE SULFATE (PF) 2 MG/ML IV SOLN: 1.0000 mg | INTRAVENOUS | Status: DC | PRN

## 2018-10-09 MED ORDER — ACETAMINOPHEN 10 MG/ML IV SOLN
INTRAVENOUS | Status: DC | PRN
  Administered 2018-10-09: 1000 mg via INTRAVENOUS

## 2018-10-09 MED ORDER — PROMETHAZINE HCL 25 MG/ML IJ SOLN: 6.2500 mg | INTRAMUSCULAR | Status: DC | PRN

## 2018-10-09 MED ORDER — CEFAZOLIN SODIUM-DEXTROSE 2-4 GM/100ML-% IV SOLN
INTRAVENOUS | Status: AC
  Filled 2018-10-09: qty 100

## 2018-10-09 MED ORDER — DOCUSATE SODIUM 100 MG PO CAPS: 100.0000 mg | ORAL_CAPSULE | Freq: Every day | ORAL | 2 refills | Status: AC | PRN

## 2018-10-09 MED ORDER — LACTATED RINGERS IV SOLN
INTRAVENOUS | Status: DC
  Administered 2018-10-09: 07:00:00 via INTRAVENOUS

## 2018-10-09 MED ORDER — LACTATED RINGERS IV SOLN
INTRAVENOUS | Status: DC
  Administered 2018-10-09 (×2): via INTRAVENOUS

## 2018-10-09 MED ORDER — FAMOTIDINE 20 MG PO TABS
20.0000 mg | ORAL_TABLET | Freq: Once | ORAL | Status: AC
  Administered 2018-10-09: 20 mg via ORAL

## 2018-10-09 MED ORDER — HYDROCODONE-ACETAMINOPHEN 5-325 MG PO TABS
1.0000 | ORAL_TABLET | ORAL | Status: DC | PRN
  Administered 2018-10-09: 1 via ORAL
  Filled 2018-10-09: qty 1

## 2018-10-09 MED ORDER — KETOROLAC TROMETHAMINE 30 MG/ML IJ SOLN
INTRAMUSCULAR | Status: AC
  Filled 2018-10-09: qty 1

## 2018-10-09 MED ORDER — ONDANSETRON HCL 4 MG/2ML IJ SOLN
INTRAMUSCULAR | Status: AC
  Filled 2018-10-09: qty 2

## 2018-10-09 MED ORDER — SILVER NITRATE-POT NITRATE 75-25 % EX MISC
CUTANEOUS | Status: AC
  Filled 2018-10-09: qty 1

## 2018-10-09 MED ORDER — LIDOCAINE HCL (CARDIAC) PF 100 MG/5ML IV SOSY
PREFILLED_SYRINGE | INTRAVENOUS | Status: DC | PRN
  Administered 2018-10-09: 80 mg via INTRAVENOUS

## 2018-10-09 MED ORDER — LIDOCAINE-EPINEPHRINE 1 %-1:100000 IJ SOLN
INTRAMUSCULAR | Status: AC
  Filled 2018-10-09: qty 1

## 2018-10-09 MED ORDER — CEFAZOLIN SODIUM-DEXTROSE 2-4 GM/100ML-% IV SOLN
2.0000 g | Freq: Once | INTRAVENOUS | Status: AC
  Administered 2018-10-09: 2 g via INTRAVENOUS

## 2018-10-09 MED ORDER — DEXAMETHASONE SODIUM PHOSPHATE 10 MG/ML IJ SOLN
INTRAMUSCULAR | Status: AC
  Filled 2018-10-09: qty 1

## 2018-10-09 MED ORDER — LIDOCAINE-EPINEPHRINE (PF) 1 %-1:200000 IJ SOLN
INTRAMUSCULAR | Status: AC
  Filled 2018-10-09: qty 30

## 2018-10-09 MED ORDER — FLEET ENEMA 7-19 GM/118ML RE ENEM: 1.0000 | ENEMA | Freq: Once | RECTAL | Status: DC

## 2018-10-09 MED ORDER — MIDAZOLAM HCL 2 MG/2ML IJ SOLN
INTRAMUSCULAR | Status: DC | PRN
  Administered 2018-10-09: 2 mg via INTRAVENOUS

## 2018-10-09 MED ORDER — MIDAZOLAM HCL 2 MG/2ML IJ SOLN
INTRAMUSCULAR | Status: AC
  Filled 2018-10-09: qty 2

## 2018-10-09 MED ORDER — HYDROCODONE-ACETAMINOPHEN 5-325 MG PO TABS: 1.0000 | ORAL_TABLET | ORAL | 0 refills | Status: DC | PRN

## 2018-10-09 SURGICAL SUPPLY — 34 items
BAG URINE DRAINAGE (UROLOGICAL SUPPLIES) ×2 IMPLANT
CANISTER SUCT 1200ML W/VALVE (MISCELLANEOUS) ×2 IMPLANT
CATH FOLEY 2WAY  5CC 16FR (CATHETERS) ×1
CATH ROBINSON RED A/P 16FR (CATHETERS) ×2 IMPLANT
CATH URTH 16FR FL 2W BLN LF (CATHETERS) ×1 IMPLANT
COVER WAND RF STERILE (DRAPES) ×2 IMPLANT
DRAPE PERI LITHO V/GYN (MISCELLANEOUS) ×2 IMPLANT
DRAPE SURG 17X11 SM STRL (DRAPES) ×2 IMPLANT
DRAPE UNDER BUTTOCK W/FLU (DRAPES) ×2 IMPLANT
ELECT REM PT RETURN 9FT ADLT (ELECTROSURGICAL) ×2
ELECTRODE REM PT RTRN 9FT ADLT (ELECTROSURGICAL) ×1 IMPLANT
ETHIBOND 2 0 GREEN CT 2 30IN (SUTURE) ×4 IMPLANT
GAUZE PACK 2X3YD (MISCELLANEOUS) ×2 IMPLANT
GLOVE BIO SURGEON STRL SZ8 (GLOVE) ×2 IMPLANT
GOWN STRL REUS W/ TWL LRG LVL3 (GOWN DISPOSABLE) ×3 IMPLANT
GOWN STRL REUS W/ TWL XL LVL3 (GOWN DISPOSABLE) ×1 IMPLANT
GOWN STRL REUS W/TWL LRG LVL3 (GOWN DISPOSABLE) ×3
GOWN STRL REUS W/TWL XL LVL3 (GOWN DISPOSABLE) ×1
KIT TURNOVER CYSTO (KITS) ×2 IMPLANT
KIT TURNOVER KIT A (KITS) ×2 IMPLANT
LABEL OR SOLS (LABEL) ×2 IMPLANT
NEEDLE HYPO 22GX1.5 SAFETY (NEEDLE) ×2 IMPLANT
NS IRRIG 500ML POUR BTL (IV SOLUTION) ×2 IMPLANT
PACK BASIN MINOR ARMC (MISCELLANEOUS) ×2 IMPLANT
PAD OB MATERNITY 4.3X12.25 (PERSONAL CARE ITEMS) ×2 IMPLANT
PAD PREP 24X41 OB/GYN DISP (PERSONAL CARE ITEMS) ×2 IMPLANT
SUT PDS AB 2-0 CT1 27 (SUTURE) ×2 IMPLANT
SUT VIC AB 0 CT1 36 (SUTURE) ×1 IMPLANT
SUT VIC AB 2-0 CT1 36 (SUTURE) ×4 IMPLANT
SUT VIC AB 2-0 SH 27 (SUTURE) ×3
SUT VIC AB 2-0 SH 27XBRD (SUTURE) ×3 IMPLANT
SUT VIC AB 3-0 SH 27 (SUTURE) ×1
SUT VIC AB 3-0 SH 27X BRD (SUTURE) ×1 IMPLANT
SYR 10ML LL (SYRINGE) ×2 IMPLANT

## 2018-10-09 NOTE — Progress Notes (Signed)
Pt up to bathroom for the second time. Denies pain. Bleeding better, but still bright red. No clots this time. On pad, scant bright red, then when the patient wiped, bright red blood present, saturating tissue. Drops of blood present on toilet once standing to replace pad. Will continue to monitor.

## 2018-10-09 NOTE — Transfer of Care (Signed)
Immediate Anesthesia Transfer of Care Note  Patient: [place]  Procedure(s) Performed: POSTERIOR REPAIR (RECTOCELE) (N/A )  Patient Location: PACU  Anesthesia Type:General  Level of Consciousness: awake, alert , oriented and patient cooperative  Airway & Oxygen Therapy: Patient Spontanous Breathing and Patient connected to face mask oxygen  Post-op Assessment: Report given to RN and Post -op Vital signs reviewed and stable  Post vital signs: Reviewed and stable  Last Vitals:  Vitals Value Taken Time  BP 136/74 10/09/2018  9:04 AM  Temp    Pulse 65 10/09/2018  9:07 AM  Resp 23 10/09/2018  9:07 AM  SpO2 100 % 10/09/2018  9:07 AM  Vitals shown include unvalidated device data.  Last Pain:  Vitals:   10/09/18 0609  TempSrc: Tympanic  PainSc: 0-No pain         Complications: No apparent anesthesia complications

## 2018-10-09 NOTE — Anesthesia Postprocedure Evaluation (Signed)
Anesthesia Post Note  Patient: Megan Nichols  Procedure(s) Performed: POSTERIOR REPAIR (RECTOCELE) (N/A )  Patient location during evaluation: PACU Anesthesia Type: General Level of consciousness: awake and alert Pain management: pain level controlled Vital Signs Assessment: post-procedure vital signs reviewed and stable Respiratory status: spontaneous breathing, nonlabored ventilation, respiratory function stable and patient connected to nasal cannula oxygen Cardiovascular status: blood pressure returned to baseline and stable Postop Assessment: no apparent nausea or vomiting Anesthetic complications: no     Last Vitals:  Vitals:   10/09/18 1130 10/09/18 1625  BP: 115/66 93/64  Pulse: 64 60  Resp: 18 18  Temp: (!) 36.1 C 36.5 C  SpO2: 98% 97%    Last Pain:  Vitals:   10/09/18 1625  TempSrc: Oral  PainSc:                  Martha Clan

## 2018-10-09 NOTE — Anesthesia Post-op Follow-up Note (Signed)
Anesthesia QCDR form completed.        

## 2018-10-09 NOTE — Progress Notes (Signed)
Patient up to bathroom for the 4th time since surgery. Bleeding much improved as urine in hat is clear yellow, no clots present, scant bleeding on pad and tissue after wipe. Pt continues to deny pain.

## 2018-10-09 NOTE — Progress Notes (Signed)
When NT assisted pt to bathroom, pt was able to void, but moderate amount of bright red blood with 3 clots (golf ball sized) was found in urine hat and toilet. Until this point, no blood was evident on pad since operation. VS stable. Informed Dr. Ouida Sills, will continue to monitor.

## 2018-10-09 NOTE — Op Note (Signed)
NAME: Megan Nichols, Megan Nichols MEDICAL RECORD EX:[id number] ACCOUNT [id number] DATE OF BIRTH:30-Sep-1956 FACILITY: [facility] LOCATION: [facility] PHYSICIAN:THOMAS Josefine Class, MD  OPERATIVE REPORT  DATE OF PROCEDURE:  10/09/2018  PREOPERATIVE DIAGNOSIS:  Third degree posterior rectocele.  POSTOPERATIVE DIAGNOSIS:  Third degree posterior rectocele.  PROCEDURE:  Posterior colporrhaphy.  ANESTHESIA:  General endotracheal anesthesia.  SURGEON:  Laverta Baltimore, MD.  FIRST ASSISTANT:  Larey Days, MD.  INDICATIONS:  A 62 year old female with a symptomatic third degree rectocele and desires surgical repair.  DESCRIPTION OF PROCEDURE:  After adequate general endotracheal anesthesia, the patient was placed in dorsal supine position, legs placed in the candy cane stirrups.  Lower abdomen, perineum, and vagina were prepped and draped in normal sterile fashion.   The patient did receive 2 grams IV Ancef prior to commencement of the case for surgical prophylaxis.  Timeout was performed.  Straight catheterization of the bladder yielded 250 mL of urine.  Attention was directed to the posterior vagina where the large  rectocele was noted.  The hymenal ring was grasped with two Allis clamps and a diamond-shape incision was made.  The posterior vagina and the perineal body after injecting with 1% lidocaine with 1:100,000 epinephrine.  The posterior vaginal epithelium  was injected centrally above the defect.  The vaginal epithelium was undermined with Metzenbaum scissors centrally and opened.  The rectocele was then dissected off of the subvaginal epithelial tissue.  Good healthy endopelvic fascia was identified and  the repair was performed with interrupted mattress sutures of 2-0 Vicryl suture with good defect of the rectocele.  Sequential sutures were used to the hymenal ring.  Vaginal tissues were trimmed and the vaginal epithelium was closed with a running 0  Vicryl suture.  The perineal body was  reapproximated with a 2-0 Vicryl suture and a subcuticular stitch was used to close the skin.  There was minimal bleeding during the procedure.  The patient's bladder was recatheterized at the end of the case  yielding additional 50 mL.  Adequate vaginal depth was verified at the end of the case.  COMPLICATIONS:  There were no complications.  ESTIMATED BLOOD LOSS:  20 mL  INTRAOPERATIVE FLUIDS:  500 mL  URINE OUTPUT:  300 mL.    The patient did receive 30 mg IV Toradol at the end of the case and 1000 mg of Tylenol intravenously.  The patient was taken to recovery room in good condition.  TN/NUANCE  D:10/09/2018 T:10/09/2018 JOB:[id number]

## 2018-10-09 NOTE — Progress Notes (Addendum)
All MD discharge instructions reviewed with patient; copy given ;Rxs. Given; patient verbalized understanding of all discharge instructions and to call MD office to schedule 2 week follow up appointment.Patient discharged to home. Patient left 3rd floor via wheelchair accompanied by Radene Journey NT.

## 2018-10-09 NOTE — Anesthesia Procedure Notes (Signed)
Procedure Name: LMA Insertion Date/Time: 10/09/2018 7:45 AM Performed by: Eben Burow, CRNA Pre-anesthesia Checklist: Patient identified, Emergency Drugs available, Suction available, Patient being monitored and Timeout performed Patient Re-evaluated:Patient Re-evaluated prior to induction Oxygen Delivery Method: Circle system utilized Preoxygenation: Pre-oxygenation with 100% oxygen LMA: LMA inserted LMA Size: 4.0 Number of attempts: 1 Placement Confirmation: positive ETCO2 and breath sounds checked- equal and bilateral Tube secured with: Tape Dental Injury: Teeth and Oropharynx as per pre-operative assessment

## 2018-10-09 NOTE — Discharge Summary (Signed)
Physician Discharge Summary  Patient ID: Megan Nichols MRN: [id number] DOB/AGE: 1956-05-05 62 y.o.  Admit date: 10/09/2018 Discharge date: 10/09/2018  Admission Diagnoses:rectocele  Discharge Diagnoses: same Active Problems:   Postoperative state   Discharged Condition: good  Hospital Course: she underwent an uncomplicated a psterior colporrhaphy  Small amt of vaginal bleeding . Urinating on own . Pain in good control  Consults: None  Significant Diagnostic Studies:   Treatments: surgery: as above   Discharge Exam: Blood pressure (!) 97/53, pulse (!) 55, temperature 97.9 F (36.6 C), temperature source Oral, resp. rate 20, SpO2 96 %. General appearance: alert and cooperative Head: Normocephalic, without obvious abnormality, atraumatic  Disposition: Discharge disposition: 01-Home or Self Care       Discharge Instructions    Call MD for:   Complete by:  As directed    Call MD for:  difficulty breathing, headache or visual disturbances   Complete by:  As directed    Call MD for:  difficulty breathing, headache or visual disturbances   Complete by:  As directed    Call MD for:  extreme fatigue   Complete by:  As directed    Call MD for:  extreme fatigue   Complete by:  As directed    Call MD for:  hives   Complete by:  As directed    Call MD for:  hives   Complete by:  As directed    Call MD for:  persistant dizziness or light-headedness   Complete by:  As directed    Call MD for:  persistant dizziness or light-headedness   Complete by:  As directed    Call MD for:  persistant nausea and vomiting   Complete by:  As directed    Call MD for:  persistant nausea and vomiting   Complete by:  As directed    Call MD for:  redness, tenderness, or signs of infection (pain, swelling, redness, odor or green/yellow discharge around incision site)   Complete by:  As directed    Call MD for:  redness, tenderness, or signs of infection (pain, swelling, redness, odor  or green/yellow discharge around incision site)   Complete by:  As directed    Call MD for:  severe uncontrolled pain   Complete by:  As directed    Call MD for:  severe uncontrolled pain   Complete by:  As directed    Call MD for:  temperature >100.4   Complete by:  As directed    Call MD for:  temperature >100.4   Complete by:  As directed    Diet - low sodium heart healthy   Complete by:  As directed    Diet - low sodium heart healthy   Complete by:  As directed    Increase activity slowly   Complete by:  As directed    Increase activity slowly   Complete by:  As directed      Allergies as of 10/09/2018   No Known Allergies     Medication List    STOP taking these medications   acetaminophen 325 MG tablet Commonly known as:  TYLENOL   cholecalciferol 1000 units tablet Commonly known as:  VITAMIN D   ibuprofen 200 MG tablet Commonly known as:  ADVIL,MOTRIN     TAKE these medications   AIRBORNE GUMMIES PO Take 3 each by mouth daily.   albuterol 108 (90 Base) MCG/ACT inhaler Commonly known as:  PROVENTIL HFA;VENTOLIN HFA Inhale 1-2 puffs into  the lungs every 6 (six) hours as needed for wheezing or shortness of breath.   docusate sodium 100 MG capsule Commonly known as:  COLACE Take 1 capsule (100 mg total) by mouth daily as needed.   escitalopram 10 MG tablet Commonly known as:  LEXAPRO Take 1 tablet (10 mg total) by mouth daily.   HYDROcodone-acetaminophen 5-325 MG tablet Commonly known as:  NORCO/VICODIN Take 1 tablet by mouth every 4 (four) hours as needed for moderate pain.   ondansetron 4 MG tablet Commonly known as:  ZOFRAN Take 1 tablet (4 mg total) by mouth every 6 (six) hours as needed for nausea.      Follow-up Information    Conal Shetley, Gwen Her, MD Follow up in 2 week(s).   Specialty:  Obstetrics and Gynecology Contact information: [address] [place] [address] [phone]            Signed: Gwen Her Adonys Wildes 10/09/2018, 8:16 PM

## 2018-10-09 NOTE — Progress Notes (Signed)
Ready for A+P repair . All questions answered . Proceed

## 2018-10-09 NOTE — Anesthesia Preprocedure Evaluation (Signed)
Anesthesia Evaluation  Patient identified by MRN, date of birth, ID band Patient awake    Reviewed: Allergy & Precautions, H&P , NPO status , Patient's Chart, lab work & pertinent test results, reviewed documented beta blocker date and time   History of Anesthesia Complications Negative for: history of anesthetic complications  Airway Mallampati: III  TM Distance: >3 FB Neck ROM: full    Dental  (+) Dental Advidsory Given, Teeth Intact, Caps, Missing, Partial Upper   Pulmonary neg shortness of breath, neg sleep apnea, neg COPD, neg recent URI, Current Smoker,           Cardiovascular Exercise Tolerance: Good negative cardio ROS       Neuro/Psych negative neurological ROS  negative psych ROS   GI/Hepatic negative GI ROS, Neg liver ROS,   Endo/Other  negative endocrine ROS  Renal/GU negative Renal ROS  negative genitourinary   Musculoskeletal   Abdominal   Peds  Hematology negative hematology ROS (+)   Anesthesia Other Findings Past Medical History: No date: Allergy No date: Anxiety No date: Arthritis No date: Cancer [facility])     Comment:  skin ca No date: Degeneration of intervertebral disc of lumbar region No date: Depression No date: Hyperlipidemia 05/21/2016: Impaired fasting glucose No date: Irritable bowel syndrome with constipation No date: Obesity, Class I, BMI 30.0-34.9 (see actual BMI) No date: Pelvic floor dysfunction 12/26/2015: Tobacco use   Reproductive/Obstetrics negative OB ROS                             Anesthesia Physical Anesthesia Plan  ASA: II  Anesthesia Plan: General   Post-op Pain Management:    Induction: Intravenous  PONV Risk Score and Plan: 2 and Ondansetron, Dexamethasone, Midazolam, Treatment may vary due to age or medical condition and Promethazine  Airway Management Planned: LMA and Oral ETT  Additional Equipment:   Intra-op Plan:    Post-operative Plan: Extubation in OR  Informed Consent: I have reviewed the patients History and Physical, chart, labs and discussed the procedure including the risks, benefits and alternatives for the proposed anesthesia with the patient or authorized representative who has indicated his/her understanding and acceptance.   Dental Advisory Given  Plan Discussed with: Anesthesiologist, CRNA and Surgeon  Anesthesia Plan Comments:         Anesthesia Quick Evaluation

## 2018-10-09 NOTE — Brief Op Note (Signed)
10/09/2018  8:53 AM  PATIENT:  Megan Nichols  62 y.o. female  PRE-OPERATIVE DIAGNOSIS:  Rectocele, Cystocele  POST-OPERATIVE DIAGNOSIS:  Rectocele, Cystocele  PROCEDURE:  Procedure(s): POSTERIOR REPAIR (RECTOCELE) (N/A)  SURGEON:  Surgeon(s) and Role:    * Chaniyah Jahr, Gwen Her, MD - Primary    * Ward, Honor Loh, MD - Assisting  PHYSICIAN ASSISTANT:   ASSISTANTS: none   ANESTHESIA:   general  EBL:  20 mL   BLOOD ADMINISTERED:none  DRAINS: none   LOCAL MEDICATIONS USED:  LIDOCAINE  1% with epi 1:100,000- 9 cc  SPECIMEN:  No Specimen  DISPOSITION OF SPECIMEN:  N/A  COUNTS:  YES  TOURNIQUET:  * No tourniquets in log *  DICTATION: .Other Dictation: Dictation Number verbal  PLAN OF CARE: Admit for overnight observation  PATIENT DISPOSITION:  PACU - hemodynamically stable.   Delay start of Pharmacological VTE agent (>24hrs) due to surgical blood loss or risk of bleeding: not applicable

## 2018-10-10 ENCOUNTER — Encounter: Payer: Self-pay | Admitting: Obstetrics and Gynecology

## 2019-05-20 ENCOUNTER — Other Ambulatory Visit: Payer: Self-pay | Admitting: Family Medicine

## 2019-05-20 DIAGNOSIS — Z1231 Encounter for screening mammogram for malignant neoplasm of breast: Secondary | ICD-10-CM

## 2019-06-04 ENCOUNTER — Other Ambulatory Visit: Payer: Self-pay

## 2019-06-04 ENCOUNTER — Ambulatory Visit
Admission: RE | Admit: 2019-06-04 | Discharge: 2019-06-04 | Disposition: A | Discharge status: Home / Self Care | Payer: Commercial Managed Care - PPO | Source: Ambulatory Visit | Attending: Family Medicine | Admitting: Family Medicine

## 2019-06-04 DIAGNOSIS — Z1231 Encounter for screening mammogram for malignant neoplasm of breast: Secondary | ICD-10-CM | POA: Diagnosis not present

## 2020-03-02 IMAGING — MG MM DIGITAL SCREENING BILAT W/ TOMO W/ CAD
8 of 13 series · 8 of 29 positions shown · non-contrast
Comparison: Previous exam(s).

CLINICAL DATA: Screening.

EXAM:
DIGITAL SCREENING BILATERAL MAMMOGRAM WITH TOMO AND CAD

[R CC (1 of 2)]
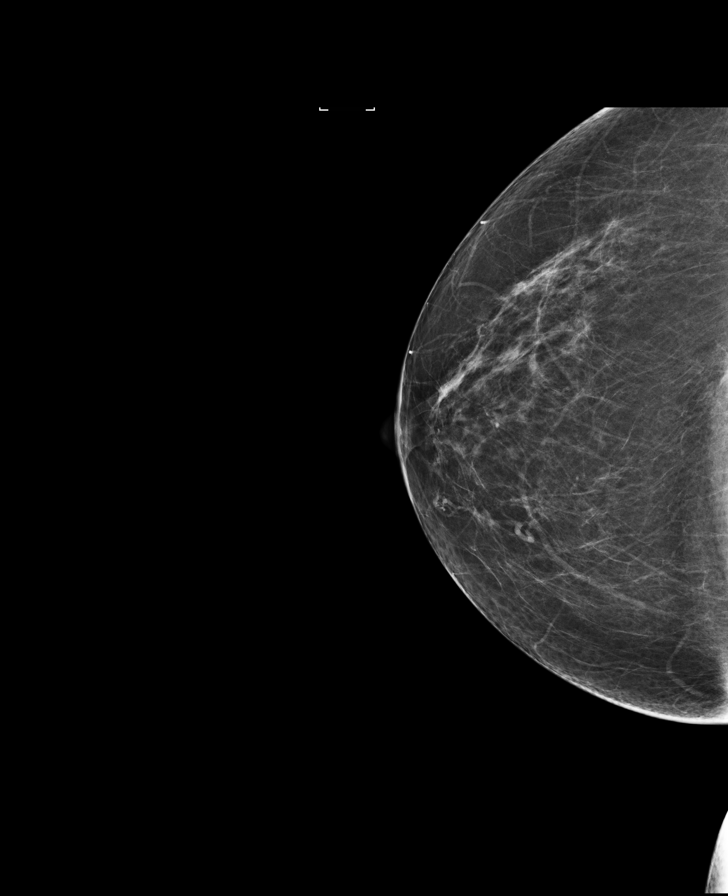

[R MLO synth-2D]
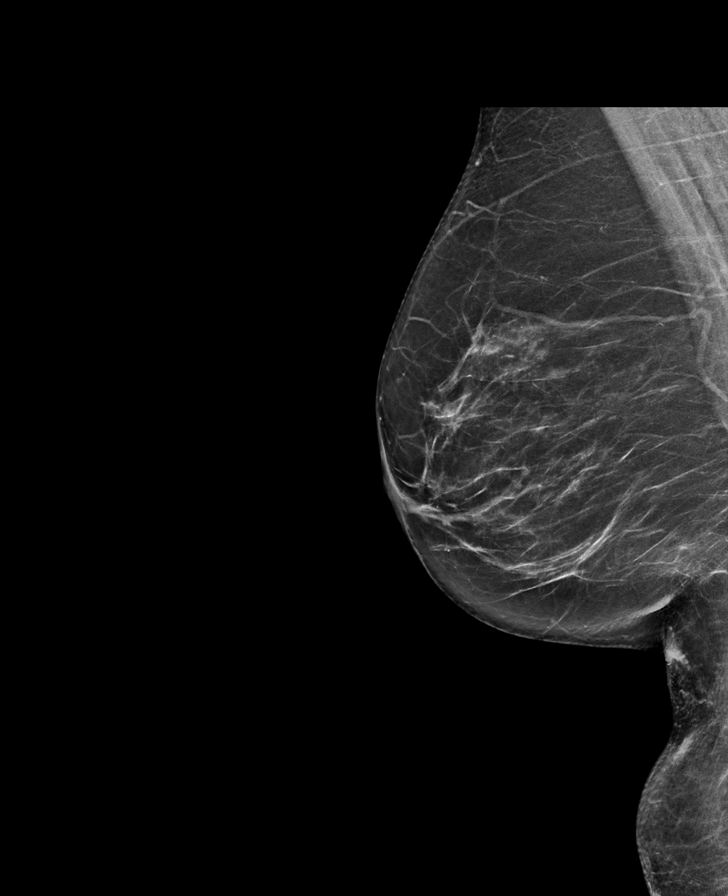

[R CC (2 of 2)]
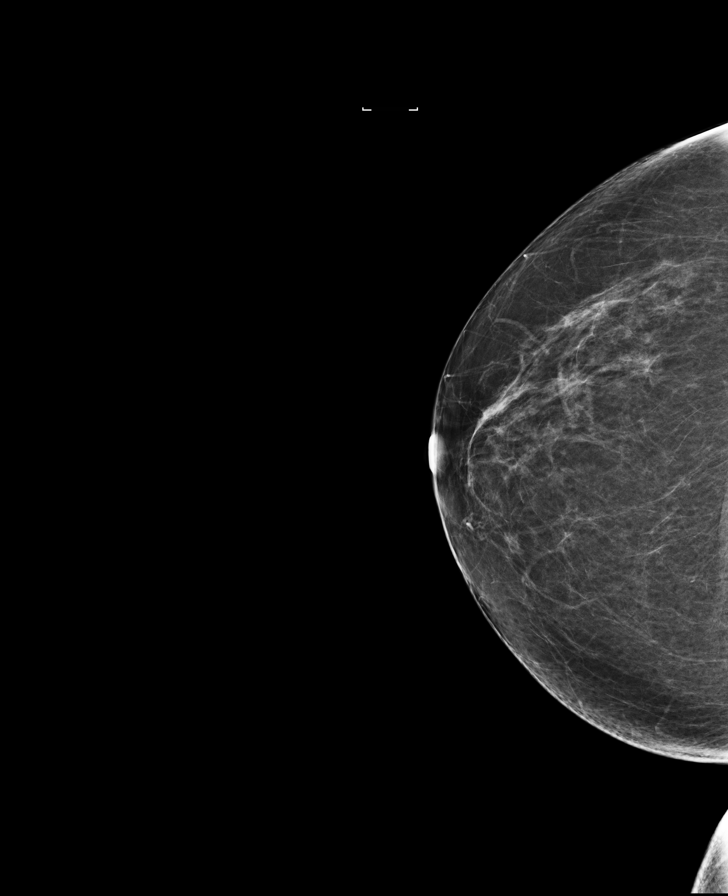

[R CC synth-2D]
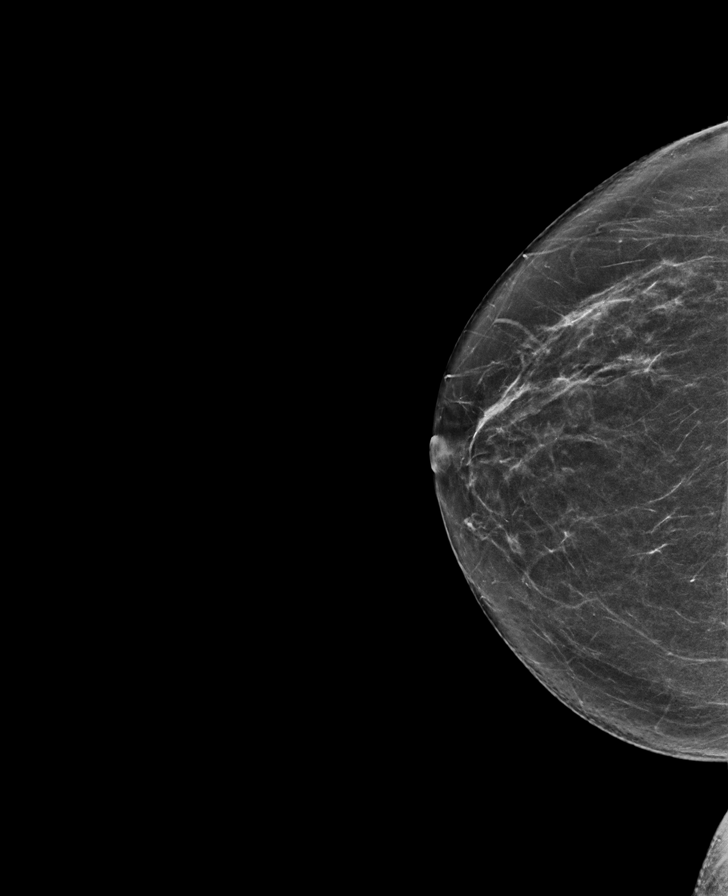

[L CC synth-2D]
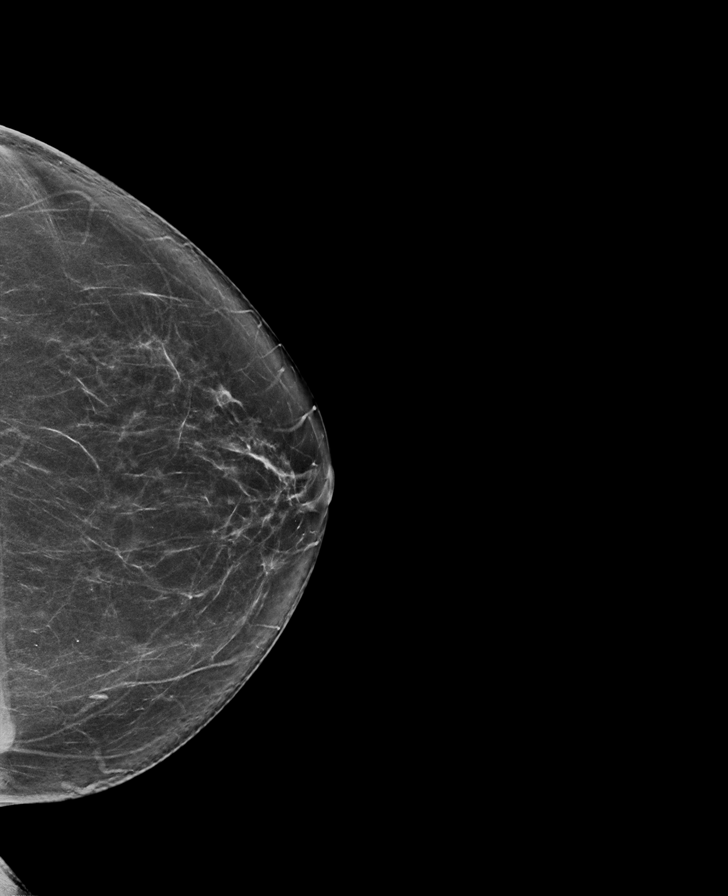

[L MLO]
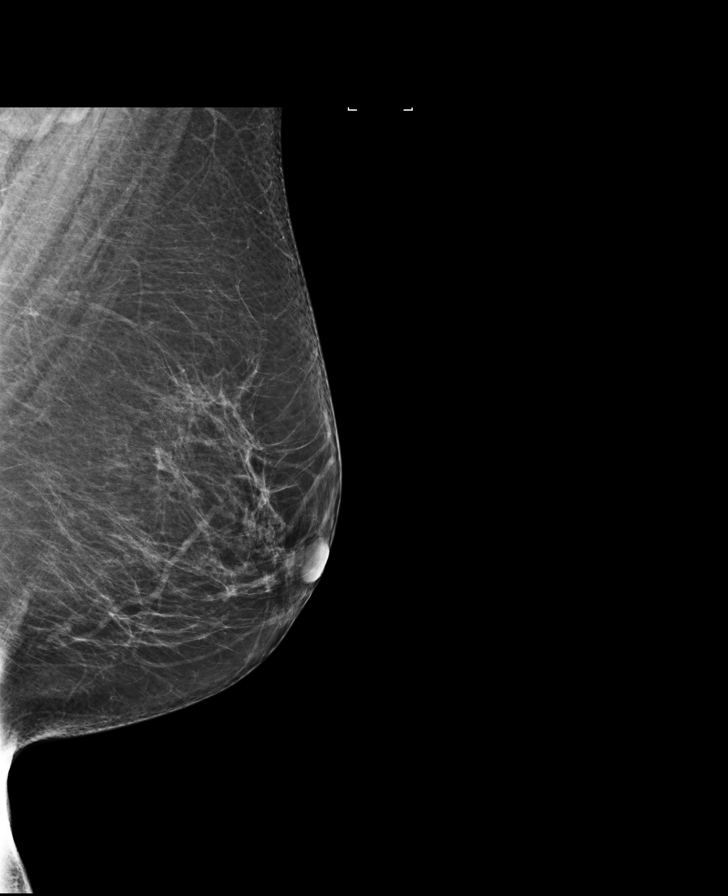

[L CC]
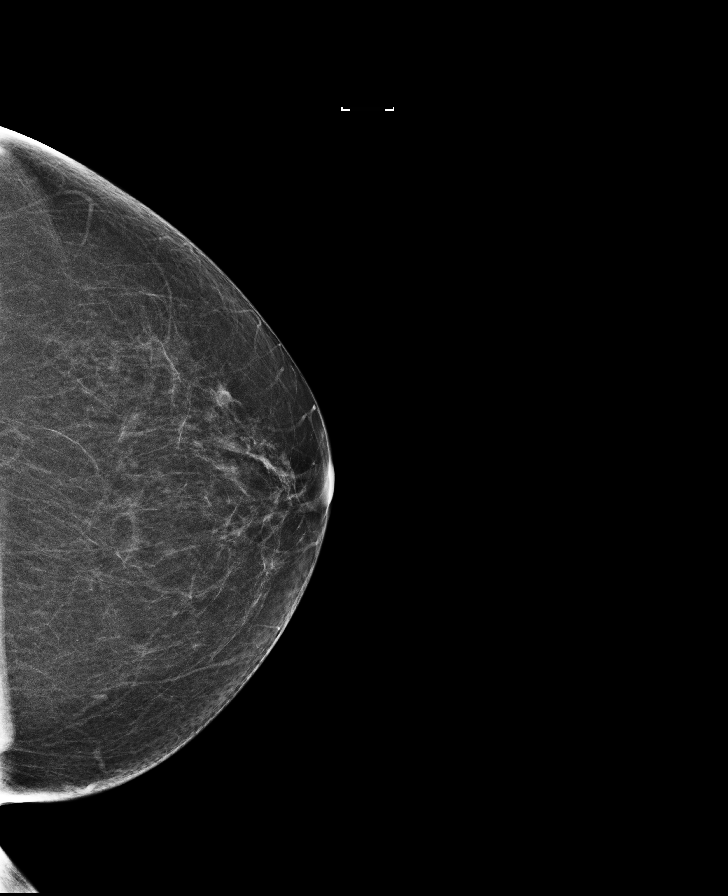

[L MLO synth-2D]
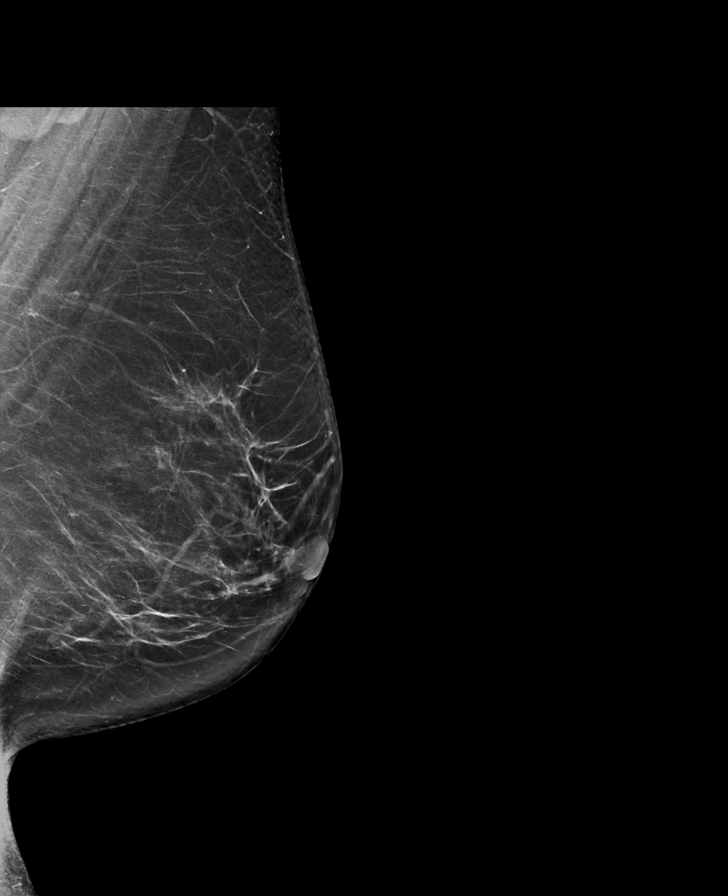

[8 of 29 positions shown; findings below may reference images not displayed]

ACR Breast Density Category b: There are scattered areas of
fibroglandular density.
FINDINGS: There are no findings suspicious for malignancy. Images were
processed with CAD.
IMPRESSION: No mammographic evidence of malignancy. A result letter of this
screening mammogram will be mailed directly to the patient.

RECOMMENDATION:
Screening mammogram in one year. (Code:[id number])

BI-RADS CATEGORY  1: Negative.

## 2020-03-23 ENCOUNTER — Encounter: Payer: Self-pay | Admitting: Emergency Medicine

## 2020-03-23 ENCOUNTER — Emergency Department: Payer: Commercial Managed Care - PPO

## 2020-03-23 ENCOUNTER — Emergency Department
Admission: EM | Admit: 2020-03-23 | Discharge: 2020-03-23 | Disposition: A | Discharge status: Home / Self Care | Payer: Commercial Managed Care - PPO | Attending: Student in an Organized Health Care Education/Training Program | Admitting: Student in an Organized Health Care Education/Training Program

## 2020-03-23 ENCOUNTER — Other Ambulatory Visit: Payer: Self-pay

## 2020-03-23 DIAGNOSIS — Y929 Unspecified place or not applicable: Secondary | ICD-10-CM | POA: Diagnosis not present

## 2020-03-23 DIAGNOSIS — W19XXXA Unspecified fall, initial encounter: Secondary | ICD-10-CM | POA: Insufficient documentation

## 2020-03-23 DIAGNOSIS — F17210 Nicotine dependence, cigarettes, uncomplicated: Secondary | ICD-10-CM | POA: Insufficient documentation

## 2020-03-23 DIAGNOSIS — S92154A Nondisplaced avulsion fracture (chip fracture) of right talus, initial encounter for closed fracture: Secondary | ICD-10-CM | POA: Diagnosis not present

## 2020-03-23 DIAGNOSIS — Y999 Unspecified external cause status: Secondary | ICD-10-CM | POA: Insufficient documentation

## 2020-03-23 DIAGNOSIS — S93491A Sprain of other ligament of right ankle, initial encounter: Secondary | ICD-10-CM | POA: Diagnosis not present

## 2020-03-23 DIAGNOSIS — Z85828 Personal history of other malignant neoplasm of skin: Secondary | ICD-10-CM | POA: Diagnosis not present

## 2020-03-23 DIAGNOSIS — Z79899 Other long term (current) drug therapy: Secondary | ICD-10-CM | POA: Insufficient documentation

## 2020-03-23 DIAGNOSIS — Y939 Activity, unspecified: Secondary | ICD-10-CM | POA: Insufficient documentation

## 2020-03-23 DIAGNOSIS — S99911A Unspecified injury of right ankle, initial encounter: Secondary | ICD-10-CM | POA: Diagnosis present

## 2020-03-23 MED ORDER — MELOXICAM 7.5 MG PO TABS
15.0000 mg | ORAL_TABLET | Freq: Once | ORAL | Status: AC
  Administered 2020-03-23: 15 mg via ORAL
  Filled 2020-03-23: qty 2

## 2020-03-23 MED ORDER — MELOXICAM 15 MG PO TABS: 15.0000 mg | ORAL_TABLET | Freq: Every day | ORAL | 0 refills | Status: DC

## 2020-03-23 NOTE — ED Provider Notes (Signed)
[facility] Emergency Department Provider Note  ____________________________________________  Time seen: Approximately 9:48 PM  I have reviewed the triage vital signs and the nursing notes.   HISTORY  Chief Complaint Fall    HPI Megan Nichols is a 64 y.o. female who presents the emergency department complaining of right ankle pain.  Patient states that she is unsure how she injured her ankle initially whether she had an inversion or eversion type injury.  Patient states however that her ankle gave way, she landed on her ankle during the fall.  No other injury or complaint.  Patient reports swelling to the anterolateral aspect of the ankle.  Patient took over-the-counter medications without significant improvement.         Past Medical History:  Diagnosis Date  . Allergy   . Anxiety   . Arthritis   . Cancer ([place])    skin ca  . Degeneration of intervertebral disc of lumbar region   . Depression   . Hyperlipidemia   . Impaired fasting glucose 05/21/2016  . Irritable bowel syndrome with constipation   . Obesity, Class I, BMI 30.0-34.9 (see actual BMI)   . Pelvic floor dysfunction   . Tobacco use 12/26/2015    Patient Active Problem List   Diagnosis Date Noted  . Postoperative state 10/09/2018  . Varicose veins of both lower extremities with pain 05/09/2018  . Chronic venous insufficiency 05/09/2018  . Impaired fasting glucose 05/21/2016  . Obesity 05/21/2016  . Encounter for smoking cessation counseling 12/26/2015  . Tobacco use 12/26/2015  . Cervical disc disease 11/17/2015  . Other fatigue 11/17/2015  . Arthritis 11/17/2015  . Degeneration of intervertebral disc of lumbar region 11/17/2015  . Anxiety and depression 11/17/2015  . Irritable bowel syndrome with constipation 11/17/2015  . Need for pneumococcal vaccination 11/17/2015  . Hyperlipidemia LDL goal <100 07/14/2014    Past Surgical History:  Procedure Laterality Date  .  APPENDECTOMY    . BLADDER SURGERY    . BREAST CYST ASPIRATION Left    1999  . HEMORRHOID SURGERY    . RECTOCELE REPAIR N/A 10/09/2018   Procedure: POSTERIOR REPAIR (RECTOCELE);  Surgeon: Schermerhorn, Gwen Her, MD;  Location: [facility] ORS;  Service: Gynecology;  Laterality: N/A;  . TONSILLECTOMY      Prior to Admission medications   Medication Sig Start Date End Date Taking? Authorizing Provider  albuterol (PROVENTIL HFA;VENTOLIN HFA) 108 (90 Base) MCG/ACT inhaler Inhale 1-2 puffs into the lungs every 6 (six) hours as needed for wheezing or shortness of breath. Patient not taking: Reported on 09/24/2018 12/15/17   Hubbard Hartshorn, FNP  escitalopram (LEXAPRO) 10 MG tablet Take 1 tablet (10 mg total) by mouth daily. Patient not taking: Reported on 05/07/2018 11/27/17   Arnetha Courser, MD  HYDROcodone-acetaminophen (NORCO/VICODIN) 5-325 MG tablet Take 1 tablet by mouth every 4 (four) hours as needed for moderate pain. 10/09/18   Schermerhorn, Gwen Her, MD  Multiple Vitamins-Minerals (AIRBORNE GUMMIES PO) Take 3 each by mouth daily.    [provider]  ondansetron (ZOFRAN) 4 MG tablet Take 1 tablet (4 mg total) by mouth every 6 (six) hours as needed for nausea. 10/09/18   Schermerhorn, Gwen Her, MD    Allergies Patient has no known allergies.  Family History  Problem Relation Age of Onset  . Diabetes Father   . Heart disease Father   . Hypothyroidism Father   . Hypertension Father   . Leukemia Father   . Cancer Father   .  Intracerebral hemorrhage Mother   . AAA (abdominal aortic aneurysm) Mother   . Stroke Mother   . Hypothyroidism Sister   . Hypertension Sister   . Stroke Maternal Grandmother   . Heart disease Maternal Grandmother   . Cancer Maternal Grandfather        ?  . Diabetes Paternal Grandmother   . Heart attack Paternal Grandfather   . Hypothyroidism Sister   . Hypertension Sister   . Hypothyroidism Sister   . Hypertension Sister   . Breast cancer Neg Hx      Social History Social History   Tobacco Use  . Smoking status: Current Every Day Smoker    Packs/day: 1.00    Years: 30.00    Pack years: 30.00    Types: Cigarettes  . Smokeless tobacco: Never Used  Substance Use Topics  . Alcohol use: Never    Alcohol/week: 0.0 standard drinks  . Drug use: No     Review of Systems  Constitutional: No fever/chills Eyes: No visual changes. No discharge ENT: No upper respiratory complaints. Cardiovascular: no chest pain. Respiratory: no cough. No SOB. Gastrointestinal: No abdominal pain.  No nausea, no vomiting.  No diarrhea.  No constipation. Musculoskeletal: Positive for right ankle pain/injury Skin: Negative for rash, abrasions, lacerations, ecchymosis. Neurological: Negative for headaches, focal weakness or numbness. 10-point ROS otherwise negative.  ____________________________________________   PHYSICAL EXAM:  VITAL SIGNS: ED Triage Vitals [03/23/20 2041]  Enc Vitals Group     BP (!) 142/83     Pulse Rate 80     Resp 16     Temp 98.7 F (37.1 C)     Temp Source Oral     SpO2 97 %     Weight 184 lb (83.5 kg)     Height 5\' 4"  (1.626 m)     Head Circumference      Peak Flow      Pain Score 10     Pain Loc      Pain Edu?      Excl. in [place]?      Constitutional: Alert and oriented. Well appearing and in no acute distress. Eyes: Conjunctivae are normal. PERRL. EOMI. Head: Atraumatic. ENT:      Ears:       Nose: No congestion/rhinnorhea.      Mouth/Throat: Mucous membranes are moist.  Neck: No stridor.    Cardiovascular: Normal rate, regular rhythm. Normal S1 and S2.  Good peripheral circulation. Respiratory: Normal respiratory effort without tachypnea or retractions. Lungs CTAB. Good air entry to the bases with no decreased or absent breath sounds. Musculoskeletal: Full range of motion to all extremities. No gross deformities appreciated.  Visualization of the right ankle reveals edema along the anterior joint line.   No abrasions or lacerations.  No ecchymosis.  Patient is tender to palpation over the talonavicular joint line with no palpable abnormality.  No other significant tenderness to palpation over the ankle joint.  Dorsalis pedis pulse intact.  Sensation intact all digits. Neurologic:  Normal speech and language. No gross focal neurologic deficits are appreciated.  Skin:  Skin is warm, dry and intact. No rash noted. Psychiatric: Mood and affect are normal. Speech and behavior are normal. Patient exhibits appropriate insight and judgement.   ____________________________________________   LABS (all labs ordered are listed, but only abnormal results are displayed)  Labs Reviewed - No data to display ____________________________________________  EKG   ____________________________________________  RADIOLOGY I personally viewed and evaluated these images as part  of my medical decision making, as well as reviewing the written report by the radiologist.  DG Ankle Complete Right  Result Date: 03/23/2020 CLINICAL DATA:  Status post fall. EXAM: RIGHT ANKLE - COMPLETE 3+ VIEW COMPARISON:  None. FINDINGS: A 6 mm linear avulsion fracture is seen along the dorsal aspect of the distal right talus. There is no evidence of dislocation. There is no evidence of arthropathy or other focal bone abnormality. Moderate severity soft tissue swelling is seen along the dorsal aspect of the proximal right foot. IMPRESSION: 6 mm linear avulsion fracture along the dorsal aspect of the distal right talus. Electronically Signed   By: Virgina Norfolk M.D.   On: 03/23/2020 21:18    ____________________________________________    PROCEDURES  Procedure(s) performed:    Procedures    Medications  meloxicam (MOBIC) tablet 15 mg (has no administration in time range)     ____________________________________________   INITIAL IMPRESSION / ASSESSMENT AND PLAN / ED COURSE  Pertinent labs & imaging results that  were available during my care of the patient were reviewed by me and considered in my medical decision making (see chart for details).  Review of the [place] CSRS was performed in accordance of the [place] prior to dispensing any controlled drugs.           Patient's diagnosis is consistent with right ankle sprain.  Patient presented to emergency department complaining of an injury to the right ankle.  Findings and imaging are most consistent with ankle sprain.  Patient had a slight avulsion fracture of the talus.  Patient will be instructed to use an ankle splint that she has already purchased, as well as crutches for ambulation.  Meloxicam for symptom improvement.  Follow-up with orthopedics as needed..  Patient is given ED precautions to return to the ED for any worsening or new symptoms.     ____________________________________________  FINAL CLINICAL IMPRESSION(S) / ED DIAGNOSES  Final diagnoses:  Sprain of anterior talofibular ligament of right ankle, initial encounter  Closed nondisplaced avulsion fracture of right talus, initial encounter      NEW MEDICATIONS STARTED DURING THIS VISIT:  ED Discharge Orders    None          This chart was dictated using voice recognition software/Dragon. Despite best efforts to proofread, errors can occur which can change the meaning. Any change was purely unintentional.    Darletta Moll, PA-C 03/23/20 2201    Merlyn Lot, MD 03/23/20 [phone]

## 2020-03-23 NOTE — ED Notes (Signed)
Pt reports falling; unsure what caused her to fall; reports discomfort at L arm as she braced herself during the fall; currently has full movement of L arm; reports pain at R ankle which pt states she fell on. Reports ankle "bending backwards". Swelling around ankle and at anterior portion of foot near dorsalis pedis pulse site. Pulse 1+ in injured foot. Foot warm, appropriate color with slight bruising noted. Cap refill <3 sec at toes of R foot. Pt can move foot and wiggle toes but does report inc in pain when doing so. Pt calmly laying in bed with visitor at bedside.

## 2020-03-23 NOTE — ED Triage Notes (Signed)
Pt to ED from home fall today at home, denies LOC or hitting head.  States right ankle bent backwards under patient when she fell, also some pain to left arm and left knee.  Patient able to bear weight on ankle, ambulatory to triage, in NAD at this time.

## 2020-07-17 ENCOUNTER — Other Ambulatory Visit: Payer: Self-pay | Admitting: Obstetrics and Gynecology

## 2020-07-17 DIAGNOSIS — Z1231 Encounter for screening mammogram for malignant neoplasm of breast: Secondary | ICD-10-CM

## 2020-08-14 ENCOUNTER — Ambulatory Visit
Admission: RE | Admit: 2020-08-14 | Discharge: 2020-08-14 | Disposition: A | Discharge status: Home / Self Care | Payer: Commercial Managed Care - PPO | Source: Ambulatory Visit | Attending: Obstetrics and Gynecology | Admitting: Obstetrics and Gynecology

## 2020-08-14 ENCOUNTER — Other Ambulatory Visit: Payer: Self-pay

## 2020-08-14 DIAGNOSIS — Z1231 Encounter for screening mammogram for malignant neoplasm of breast: Secondary | ICD-10-CM | POA: Diagnosis not present

## 2021-03-13 ENCOUNTER — Other Ambulatory Visit: Payer: Self-pay

## 2021-03-13 ENCOUNTER — Emergency Department
Admission: EM | Admit: 2021-03-13 | Discharge: 2021-03-13 | Disposition: A | Discharge status: Home / Self Care | Payer: Medicare Other | Attending: Emergency Medicine | Admitting: Emergency Medicine

## 2021-03-13 ENCOUNTER — Emergency Department: Payer: Medicare Other

## 2021-03-13 DIAGNOSIS — M25519 Pain in unspecified shoulder: Secondary | ICD-10-CM | POA: Diagnosis not present

## 2021-03-13 DIAGNOSIS — F17210 Nicotine dependence, cigarettes, uncomplicated: Secondary | ICD-10-CM | POA: Diagnosis not present

## 2021-03-13 DIAGNOSIS — R0981 Nasal congestion: Secondary | ICD-10-CM | POA: Diagnosis not present

## 2021-03-13 DIAGNOSIS — Z20822 Contact with and (suspected) exposure to covid-19: Secondary | ICD-10-CM | POA: Diagnosis not present

## 2021-03-13 DIAGNOSIS — F419 Anxiety disorder, unspecified: Secondary | ICD-10-CM | POA: Diagnosis not present

## 2021-03-13 DIAGNOSIS — R059 Cough, unspecified: Secondary | ICD-10-CM | POA: Insufficient documentation

## 2021-03-13 DIAGNOSIS — R079 Chest pain, unspecified: Secondary | ICD-10-CM | POA: Diagnosis present

## 2021-03-13 DIAGNOSIS — Z85828 Personal history of other malignant neoplasm of skin: Secondary | ICD-10-CM | POA: Diagnosis not present

## 2021-03-13 LAB — CBC
HCT: 42.2 % (ref 36.0–46.0)
Hemoglobin: 14.3 g/dL (ref 12.0–15.0)
MCH: 31.4 pg (ref 26.0–34.0)
MCHC: 33.9 g/dL (ref 30.0–36.0)
MCV: 92.5 fL (ref 80.0–100.0)
Platelets: 264 10*3/uL (ref 150–400)
RBC: 4.56 MIL/uL (ref 3.87–5.11)
RDW: 12.9 % (ref 11.5–15.5)
WBC: 7.6 10*3/uL (ref 4.0–10.5)
nRBC: 0 % (ref 0.0–0.2)

## 2021-03-13 LAB — COMPREHENSIVE METABOLIC PANEL
ALT: 15 U/L (ref 0–44)
AST: 18 U/L (ref 15–41)
Albumin: 3.8 g/dL (ref 3.5–5.0)
Alkaline Phosphatase: 49 U/L (ref 38–126)
Anion gap: 7 (ref 5–15)
BUN: 21 mg/dL (ref 8–23)
CO2: 25 mmol/L (ref 22–32)
Calcium: 8.9 mg/dL (ref 8.9–10.3)
Chloride: 105 mmol/L (ref 98–111)
Creatinine, Ser: 0.67 mg/dL (ref 0.44–1.00)
GFR, Estimated: >60 mL/min (ref >60)
Glucose, Bld: 110 mg/dL — ABNORMAL HIGH (ref 70–99)
Potassium: 3.4 mmol/L — ABNORMAL LOW (ref 3.5–5.1)
Sodium: 137 mmol/L (ref 135–145)
Total Bilirubin: 0.8 mg/dL (ref 0.3–1.2)
Total Protein: 6.3 g/dL — ABNORMAL LOW (ref 6.5–8.1)

## 2021-03-13 LAB — RESP PANEL BY RT-PCR (FLU A&B, COVID) ARPGX2
Influenza A by PCR: NEGATIVE
Influenza B by PCR: NEGATIVE
SARS Coronavirus 2 by RT PCR: NEGATIVE

## 2021-03-13 LAB — TROPONIN I (HIGH SENSITIVITY)
Troponin I (High Sensitivity): 4 ng/L (ref <18)
Troponin I (High Sensitivity): 4 ng/L (ref <18)

## 2021-03-13 NOTE — ED Triage Notes (Signed)
Pt in with co chest pain that started at 0400, has also had cough and congestion. Pt denies any cardiac history, no shob noted at this time.

## 2021-03-13 NOTE — Discharge Instructions (Addendum)
Please call the number provided for cardiology to arrange a follow-up appointment for consideration of a possible stress test.  Return to the emergency department if you develop any worsening chest pain, shortness of breath, or any other symptom personally concerning to yourself.  Please inform your primary care doctor at today's ER visit.

## 2021-03-13 NOTE — ED Provider Notes (Signed)
[facility] Emergency Department Provider Note  Time seen: 7:57 AM  I have reviewed the triage vital signs and the nursing notes.   HISTORY  Chief Complaint Chest Pain   HPI Megan Nichols is a 65 y.o. female with a past medical history of allergy, anxiety, hyperlipidemia, presents to the emergency department for chest pain.  According to the patient she awoke around 430 this morning with pain in her chest and shoulders.  Patient states she was" a full on panic" thinking that something could be wrong.  Patient states she has anxiety.  Patient states the pain was intermittent, she has noticed that it occurs more with turning or twisting motions.  Denies any pleuritic pain.  Denies any leg pain or swelling.  No history of MI or PE or DVT.  Patient states she has been somewhat congested since yesterday with a slight cough but denies any fever or shortness of breath.  No nausea.   Past Medical History:  Diagnosis Date  . Allergy   . Anxiety   . Arthritis   . Cancer ([place])    skin ca  . Degeneration of intervertebral disc of lumbar region   . Depression   . Hyperlipidemia   . Impaired fasting glucose 05/21/2016  . Irritable bowel syndrome with constipation   . Obesity, Class I, BMI 30.0-34.9 (see actual BMI)   . Pelvic floor dysfunction   . Tobacco use 12/26/2015    Patient Active Problem List   Diagnosis Date Noted  . Postoperative state 10/09/2018  . Varicose veins of both lower extremities with pain 05/09/2018  . Chronic venous insufficiency 05/09/2018  . Impaired fasting glucose 05/21/2016  . Obesity 05/21/2016  . Encounter for smoking cessation counseling 12/26/2015  . Tobacco use 12/26/2015  . Cervical disc disease 11/17/2015  . Other fatigue 11/17/2015  . Arthritis 11/17/2015  . Degeneration of intervertebral disc of lumbar region 11/17/2015  . Anxiety and depression 11/17/2015  . Irritable bowel syndrome with constipation 11/17/2015  . Need  for pneumococcal vaccination 11/17/2015  . Hyperlipidemia LDL goal <100 07/14/2014    Past Surgical History:  Procedure Laterality Date  . APPENDECTOMY    . BLADDER SURGERY    . BREAST CYST ASPIRATION Left    1999  . HEMORRHOID SURGERY    . RECTOCELE REPAIR N/A 10/09/2018   Procedure: POSTERIOR REPAIR (RECTOCELE);  Surgeon: Schermerhorn, Gwen Her, MD;  Location: [facility] ORS;  Service: Gynecology;  Laterality: N/A;  . TONSILLECTOMY      Prior to Admission medications   Medication Sig Start Date End Date Taking? Authorizing Provider  albuterol (PROVENTIL HFA;VENTOLIN HFA) 108 (90 Base) MCG/ACT inhaler Inhale 1-2 puffs into the lungs every 6 (six) hours as needed for wheezing or shortness of breath. Patient not taking: Reported on 09/24/2018 12/15/17   Hubbard Hartshorn, FNP  escitalopram (LEXAPRO) 10 MG tablet Take 1 tablet (10 mg total) by mouth daily. Patient not taking: Reported on 05/07/2018 11/27/17   Arnetha Courser, MD  HYDROcodone-acetaminophen (NORCO/VICODIN) 5-325 MG tablet Take 1 tablet by mouth every 4 (four) hours as needed for moderate pain. 10/09/18   Schermerhorn, Gwen Her, MD  meloxicam (MOBIC) 15 MG tablet Take 1 tablet (15 mg total) by mouth daily. 03/23/20   Cuthriell, Charline Bills, PA-C  Multiple Vitamins-Minerals (AIRBORNE GUMMIES PO) Take 3 each by mouth daily.    [provider]  ondansetron (ZOFRAN) 4 MG tablet Take 1 tablet (4 mg total) by mouth every 6 (six)  hours as needed for nausea. 10/09/18   Schermerhorn, Gwen Her, MD    No Known Allergies  Family History  Problem Relation Age of Onset  . Diabetes Father   . Heart disease Father   . Hypothyroidism Father   . Hypertension Father   . Leukemia Father   . Cancer Father   . Intracerebral hemorrhage Mother   . AAA (abdominal aortic aneurysm) Mother   . Stroke Mother   . Hypothyroidism Sister   . Hypertension Sister   . Stroke Maternal Grandmother   . Heart disease Maternal Grandmother   . Cancer  Maternal Grandfather        ?  . Diabetes Paternal Grandmother   . Heart attack Paternal Grandfather   . Hypothyroidism Sister   . Hypertension Sister   . Hypothyroidism Sister   . Hypertension Sister   . Breast cancer Neg Hx     Social History Social History   Tobacco Use  . Smoking status: Current Every Day Smoker    Packs/day: 1.00    Years: 30.00    Pack years: 30.00    Types: Cigarettes  . Smokeless tobacco: Never Used  Vaping Use  . Vaping Use: Never used  Substance Use Topics  . Alcohol use: Never    Alcohol/week: 0.0 standard drinks  . Drug use: No    Review of Systems Constitutional: Negative for fever. Cardiovascular: Positive for chest discomfort starting at 4:30 AM.  Somewhat reproducible with turning or twisting.  More of a dull pain. Respiratory: Negative for shortness of breath.  Slight cough. Gastrointestinal: Negative for abdominal pain, vomiting Musculoskeletal: Negative for musculoskeletal complaints Skin: Negative for skin complaints  Neurological: Negative for headache All other ROS negative  ____________________________________________   PHYSICAL EXAM:  VITAL SIGNS: ED Triage Vitals [03/13/21 0617]  Enc Vitals Group     BP (!) 170/90     Pulse Rate 64     Resp 20     Temp 97.7 F (36.5 C)     Temp Source Oral     SpO2 99 %     Weight 185 lb (83.9 kg)     Height 5\' 4"  (1.626 m)     Head Circumference      Peak Flow      Pain Score 0     Pain Loc      Pain Edu?      Excl. in [place]?    Constitutional: Alert and oriented. Well appearing and in no distress. Eyes: Normal exam ENT      Head: Normocephalic and atraumatic.      Mouth/Throat: Mucous membranes are moist. Cardiovascular: Normal rate, regular rhythm. No murmur Respiratory: Normal respiratory effort without tachypnea nor retractions. Breath sounds are clear Gastrointestinal: Soft and nontender. No distention. Musculoskeletal: Nontender with normal range of motion in all  extremities.  Neurologic:  Normal speech and language. No gross focal neurologic deficits  Skin:  Skin is warm, dry and intact.  Psychiatric: Somewhat anxious in appearance.  ____________________________________________    EKG  EKG viewed and interpreted by myself shows a normal sinus rhythm at 60 bpm with a narrow QRS, normal axis, normal intervals, no concerning ST changes.  ____________________________________________    RADIOLOGY  Chest x-ray is negative  ____________________________________________   INITIAL IMPRESSION / ASSESSMENT AND PLAN / ED COURSE  Pertinent labs & imaging results that were available during my care of the patient were reviewed by me and considered in my medical decision making (see  chart for details).   Patient presents to the emergency department for chest pain starting at 430 this morning.  Mild dull pain in the center of her chest and shoulders.  States it is somewhat reproducible with turning or twisting.  No pain with deep inspiration.  Has had a slight cough and congestion since last night as well.  Patient's work-up is very reassuring thus far, negative troponin, reassuring lab work, normal chest x-ray and reassuring EKG.  Overall patient appears well, somewhat anxious appearing.  We Nichols repeat a troponin after 2 hours as well as check a Covid swab as a precaution.  Patient agreeable to plan of care.  Patient's repeat troponin is negative.  Covid test is negative.  Patient continues to appear well.  Blood pressure is greatly improved from arrival 135/78 currently.  Is a patient appears well with a reassuring work-up I believe the patient is safe for discharge home with outpatient follow-up including cardiology referral for consideration of a stress test.  Patient agreeable to plan of care.  Provided my normal chest pain return precautions.  Megan Nichols was evaluated in Emergency Department on 03/13/2021 for the symptoms described in the history  of present illness. She was evaluated in the context of the global COVID-19 pandemic, which necessitated consideration that the patient might be at risk for infection with the SARS-CoV-2 virus that causes COVID-19. Institutional protocols and algorithms that pertain to the evaluation of patients at risk for COVID-19 are in a state of rapid change based on information released by regulatory bodies including the CDC and federal and state organizations. These policies and algorithms were followed during the patient's care in the ED.  ____________________________________________   FINAL CLINICAL IMPRESSION(S) / ED DIAGNOSES  Chest pain   Harvest Dark, MD 03/13/21 [phone]

## 2021-03-13 NOTE — ED Notes (Signed)
Pt c/o intermittent chest pain. Pt unable to describe quality of pain at this time. enies pressure or heaviness in chest. States feerls at times in R chest and at times in L chest. Pt in NAD at this time.

## 2021-03-13 NOTE — ED Notes (Signed)
Son at bedside.

## 2021-03-13 NOTE — ED Notes (Signed)
Covid swab not done yet because no tubes. Called lab to send.

## 2021-03-16 ENCOUNTER — Ambulatory Visit: Payer: Medicare Other | Admitting: Cardiology

## 2021-03-16 ENCOUNTER — Encounter: Payer: Self-pay | Admitting: Cardiology

## 2021-03-16 ENCOUNTER — Other Ambulatory Visit: Payer: Self-pay

## 2021-03-16 VITALS — BP 120/76 | HR 70 | Ht 63.0 in | Wt 186.0 lb

## 2021-03-16 DIAGNOSIS — F17200 Nicotine dependence, unspecified, uncomplicated: Secondary | ICD-10-CM

## 2021-03-16 DIAGNOSIS — E7800 Pure hypercholesterolemia, unspecified: Secondary | ICD-10-CM | POA: Diagnosis not present

## 2021-03-16 DIAGNOSIS — R072 Precordial pain: Secondary | ICD-10-CM | POA: Diagnosis not present

## 2021-03-16 MED ORDER — PRAVASTATIN SODIUM 40 MG PO TABS: 40.0000 mg | ORAL_TABLET | Freq: Every evening | ORAL | 3 refills | Status: DC

## 2021-03-16 MED ORDER — METOPROLOL TARTRATE 100 MG PO TABS: 100.0000 mg | ORAL_TABLET | Freq: Once | ORAL | 0 refills | Status: DC

## 2021-03-16 NOTE — Progress Notes (Signed)
Cardiology Office Note:    Date:  03/16/2021   ID:  Megan Nichols, DOB [birth year], MRN [id number]  PCP:  Derinda Late, [place]  Cardiologist:  Kate Sable, MD  Advanced Practice Provider:  No care team member to display Electrophysiologist:  None       Referring MD: Schermerhorn, Gwen Her,*   Chief Complaint  Patient presents with  . Other    ED follow up  for chest pain - Meds reviewed verbally with patient.    Megan Nichols is a 65 y.o. female who is being seen today for the evaluation of chest pain at the request of Schermerhorn, Gwen Her,*.   History of Present Illness:    Megan Nichols is a 65 y.o. female with a hx of anxiety, hyperlipidemia, family history of early CAD, current smoker x30 years, who presents due to chest pain.  Patient was at home about 3 days ago when she noticed chest discomfort waking her up from sleep at about 4:30 AM.  Symptoms caused her to go to the emergency room.  She was seen in the ED on 03/13/2021 due to symptoms of chest pain.  EKG and troponins were unremarkable.  Symptoms of chest discomfort stayed throughout the day till about 9 PM.  She has a family history of early CAD in her dad with CAD around 65.  She is a current smoker, smoking since age 56.  Was recently told she has hyperlipidemia.  Previously tried a statin causing muscle aches causing patient to stop.  Past Medical History:  Diagnosis Date  . Allergy   . Anxiety   . Arthritis   . Cancer ([place])    skin ca  . Degeneration of intervertebral disc of lumbar region   . Depression   . Hyperlipidemia   . Impaired fasting glucose 05/21/2016  . Irritable bowel syndrome with constipation   . Obesity, Class I, BMI 30.0-34.9 (see actual BMI)   . Pelvic floor dysfunction   . Tobacco use 12/26/2015    Past Surgical History:  Procedure Laterality Date  . APPENDECTOMY    . BLADDER SURGERY    . BREAST CYST ASPIRATION Left     1999  . HEMORRHOID SURGERY    . RECTOCELE REPAIR N/A 10/09/2018   Procedure: POSTERIOR REPAIR (RECTOCELE);  Surgeon: Schermerhorn, Gwen Her, MD;  Location: [facility] ORS;  Service: Gynecology;  Laterality: N/A;  . TONSILLECTOMY      Current Medications: Current Meds  Medication Sig  . Azelastine-Fluticasone 137-50 MCG/ACT SUSP Dymista 137 mcg-50 mcg/spray nasal spray  . Cholecalciferol 125 MCG (5000 UT) TABS Take 5,000 Units by mouth daily.  . meloxicam (MOBIC) 15 MG tablet Take 1 tablet (15 mg total) by mouth daily.  . metoprolol tartrate (LOPRESSOR) 100 MG tablet Take 1 tablet (100 mg total) by mouth once for 1 dose. Take 2 hours prior to your CT scan.  . pravastatin (PRAVACHOL) 40 MG tablet Take 1 tablet (40 mg total) by mouth every evening.  Marland Kitchen tiZANidine (ZANAFLEX) 2 MG tablet Take 2 mg by mouth at bedtime.     Allergies:   Patient has no known allergies.   Social History   Socioeconomic History  . Marital status: Single    Spouse name: Not on file  . Number of children: Not on file  . Years of education: Not on file  . Highest education level: Not on file  Occupational History  . Not on file  Tobacco Use  . Smoking status: Current Every Day Smoker    Packs/day: 1.00    Years: 30.00    Pack years: 30.00    Types: Cigarettes  . Smokeless tobacco: Never Used  Vaping Use  . Vaping Use: Never used  Substance and Sexual Activity  . Alcohol use: Never    Alcohol/week: 0.0 standard drinks  . Drug use: No  . Sexual activity: Not Currently  Other Topics Concern  . Not on file  Social History Narrative  . Not on file   Social Determinants of Health   Financial Resource Strain: Not on file  Food Insecurity: Not on file  Transportation Needs: Not on file  Physical Activity: Not on file  Stress: Not on file  Social Connections: Not on file     Family History: The patient's family history includes AAA (abdominal aortic aneurysm) in her mother; Cancer in her father and  maternal grandfather; Diabetes in her father and paternal grandmother; Heart attack in her paternal grandfather; Heart disease in her father and maternal grandmother; Hypertension in her father, sister, sister, and sister; Hypothyroidism in her father, sister, sister, and sister; Intracerebral hemorrhage in her mother; Leukemia in her father; Stroke in her maternal grandmother and mother. There is no history of Breast cancer.  ROS:   Please see the history of present illness.     All other systems reviewed and are negative.  EKGs/Labs/Other Studies Reviewed:    The following studies were reviewed today:   EKG:  EKG is  ordered today.  The ekg ordered today demonstrates sinus rhythm, normal ECG  Recent Labs: 03/13/2021: ALT 15; BUN 21; Creatinine, Ser 0.67; Hemoglobin 14.3; Platelets 264; Potassium 3.4; Sodium 137  Recent Lipid Panel    Component Value Date/Time   CHOL 232 (H) 07/03/2016 0814   TRIG 73 07/03/2016 0814   HDL 76 07/03/2016 0814   CHOLHDL 3.3 11/17/2015 0856   LDLCALC 141 (H) 07/03/2016 0814     Risk Assessment/Calculations:      Physical Exam:    VS:  BP 120/76 (BP Location: Left Arm, Patient Position: Sitting, Cuff Size: Normal)   Pulse 70   Ht 5\' 3"  (1.6 m)   Wt 186 lb (84.4 kg)   SpO2 97%   BMI 32.95 kg/m     Wt Readings from Last 3 Encounters:  03/16/21 186 lb (84.4 kg)  03/13/21 185 lb (83.9 kg)  03/23/20 184 lb (83.5 kg)     GEN:  Well nourished, well developed in no acute distress HEENT: Normal NECK: No JVD; No carotid bruits LYMPHATICS: No lymphadenopathy CARDIAC: RRR, no murmurs, rubs, gallops RESPIRATORY:  Clear to auscultation without rales, wheezing or rhonchi  ABDOMEN: Soft, non-tender, non-distended MUSCULOSKELETAL:  No edema; No deformity  SKIN: Warm and dry NEUROLOGIC:  Alert and oriented x 3 PSYCHIATRIC:  Normal affect   ASSESSMENT:    1. Precordial pain   2. Pure hypercholesterolemia   3. Smoking    PLAN:    In order of  problems listed above:  1. Chest pain, risk factors of hyperlipidemia, current smoker, family history of CAD.  Get echocardiogram, get coronary CTA. 2. Hyperlipidemia, 10-year ASCVD risk 14.1%.  Previous statin intolerance, start Pravachol 40 mg daily.  If patient not able to tolerate, plan to start PCSK9. 3. Current smoker, cessation advised, over 5 minutes spent counseling patient.  Follow-up after echo and coronary CTA.     Medication Adjustments/Labs and Tests Ordered: Current medicines are reviewed at length  with the patient today.  Concerns regarding medicines are outlined above.  Orders Placed This Encounter  Procedures  . CT CORONARY MORPH W/CTA COR W/SCORE W/CA W/CM &/OR WO/CM  . CT CORONARY FRACTIONAL FLOW RESERVE DATA PREP  . CT CORONARY FRACTIONAL FLOW RESERVE FLUID ANALYSIS  . Basic metabolic panel  . EKG 12-Lead  . ECHOCARDIOGRAM COMPLETE   Meds ordered this encounter  Medications  . pravastatin (PRAVACHOL) 40 MG tablet    Sig: Take 1 tablet (40 mg total) by mouth every evening.    Dispense:  90 tablet    Refill:  3  . metoprolol tartrate (LOPRESSOR) 100 MG tablet    Sig: Take 1 tablet (100 mg total) by mouth once for 1 dose. Take 2 hours prior to your CT scan.    Dispense:  1 tablet    Refill:  0    Patient Instructions  Medication Instructions:   Your physician has recommended you make the following change in your medication:   1.  START taking Pravachol 40 MG once a day.   *If you need a refill on your cardiac medications before your next appointment, please call your pharmacy*   Lab Work: BMP to be drawn today  If you have labs (blood work) drawn today and your tests are completely normal, you will receive your results only by: Marland Kitchen MyChart Message (if you have MyChart) OR . A paper copy in the mail If you have any lab test that is abnormal or we need to change your treatment, we will call you to review the results.   Testing/Procedures:  1.  Your  physician has requested that you have an echocardiogram. Echocardiography is a painless test that uses sound waves to create images of your heart. It provides your doctor with information about the size and shape of your heart and how well your heart's chambers and valves are working. This procedure takes approximately one hour. There are no restrictions for this procedure.  2.  Your physician has requested that you have coronary cardiac CT. Cardiac computed tomography (CT) is a painless test that uses an x-ray machine to take clear, detailed pictures of your heart.    Your cardiac CT will be scheduled at:  [facility] [address] [place], [place] [address] [phone]  Please arrive 15 mins early for check-in and test prep.    Please follow these instructions carefully (unless otherwise directed):    On the Night Before the Test: . Be sure to Drink plenty of water. . Do not consume any caffeinated/decaffeinated beverages or chocolate 12 hours prior to your test. . Do not take any antihistamines 12 hours prior to your test.     On the Day of the Test: . Drink plenty of water until 1 hour prior to the test. . Do not eat any food 4 hours prior to the test. . You may take your regular medications prior to the test.  . Take metoprolol (Lopressor) two hours prior to test. (this has been sent to your pharmacy) . FEMALES- please wear underwire-free bra if available         After the Test: . Drink plenty of water. . After receiving IV contrast, you may experience a mild flushed feeling. This is normal. . On occasion, you may experience a mild rash up to 24 hours after the test. This is not dangerous. If this occurs, you can take Benadryl 25 mg and increase your fluid intake. Marland Kitchen  If you experience trouble breathing, this can be serious. If it is severe call 911 IMMEDIATELY. If it is mild, please call our office.    Once we have  confirmed authorization from your insurance company, we will call you to set up a date and time for your test. Based on how quickly your insurance processes prior authorizations requests, please allow up to 4 weeks to be contacted for scheduling your Cardiac CT appointment. Be advised that routine Cardiac CT appointments could be scheduled as many as 8 weeks after your provider has ordered it.  For non-scheduling related questions, please contact the cardiac imaging nurse navigator should you have any questions/concerns: Marchia Bond, Cardiac Imaging Nurse Navigator Gordy Clement, Cardiac Imaging Nurse Navigator [facility] Direct Office Dial: [phone]   For scheduling needs, including cancellations and rescheduling, please call [place], [phone].    Follow-Up: At [facility], you and your health needs are our priority.  As part of our continuing mission to provide you with exceptional heart care, we have created designated Provider Care Teams.  These Care Teams include your primary Cardiologist (physician) and Advanced Practice Providers (APPs -  Physician Assistants and Nurse Practitioners) who all work together to provide you with the care you need, when you need it.  We recommend signing up for the patient portal called "MyChart".  Sign up information is provided on this After Visit Summary.  MyChart is used to connect with patients for Virtual Visits (Telemedicine).  Patients are able to view lab/test results, encounter notes, upcoming appointments, etc.  Non-urgent messages can be sent to your provider as well.   To learn more about what you can do with MyChart, go to [email or web address].    Your next appointment:   7 weeks  The format for your next appointment:   In Person  Provider:   Kate Sable, MD   Other Instructions      Signed, Kate Sable, MD  03/16/2021 11:21 AM    [place]

## 2021-03-16 NOTE — Patient Instructions (Signed)
Medication Instructions:   Your physician has recommended you make the following change in your medication:   1.  START taking Pravachol 40 MG once a day.   *If you need a refill on your cardiac medications before your next appointment, please call your pharmacy*   Lab Work: BMP to be drawn today  If you have labs (blood work) drawn today and your tests are completely normal, you will receive your results only by: Marland Kitchen MyChart Message (if you have MyChart) OR . A paper copy in the mail If you have any lab test that is abnormal or we need to change your treatment, we will call you to review the results.   Testing/Procedures:  1.  Your physician has requested that you have an echocardiogram. Echocardiography is a painless test that uses sound waves to create images of your heart. It provides your doctor with information about the size and shape of your heart and how well your heart's chambers and valves are working. This procedure takes approximately one hour. There are no restrictions for this procedure.  2.  Your physician has requested that you have coronary cardiac CT. Cardiac computed tomography (CT) is a painless test that uses an x-ray machine to take clear, detailed pictures of your heart.    Your cardiac CT will be scheduled at:  [facility] [address] [place], [place] [address] [phone]  Please arrive 15 mins early for check-in and test prep.    Please follow these instructions carefully (unless otherwise directed):    On the Night Before the Test: . Be sure to Drink plenty of water. . Do not consume any caffeinated/decaffeinated beverages or chocolate 12 hours prior to your test. . Do not take any antihistamines 12 hours prior to your test.     On the Day of the Test: . Drink plenty of water until 1 hour prior to the test. . Do not eat any food 4 hours prior to the test. . You may take your regular  medications prior to the test.  . Take metoprolol (Lopressor) two hours prior to test. (this has been sent to your pharmacy) . FEMALES- please wear underwire-free bra if available         After the Test: . Drink plenty of water. . After receiving IV contrast, you may experience a mild flushed feeling. This is normal. . On occasion, you may experience a mild rash up to 24 hours after the test. This is not dangerous. If this occurs, you can take Benadryl 25 mg and increase your fluid intake. . If you experience trouble breathing, this can be serious. If it is severe call 911 IMMEDIATELY. If it is mild, please call our office.    Once we have confirmed authorization from your insurance company, we will call you to set up a date and time for your test. Based on how quickly your insurance processes prior authorizations requests, please allow up to 4 weeks to be contacted for scheduling your Cardiac CT appointment. Be advised that routine Cardiac CT appointments could be scheduled as many as 8 weeks after your provider has ordered it.  For non-scheduling related questions, please contact the cardiac imaging nurse navigator should you have any questions/concerns: Marchia Bond, Cardiac Imaging Nurse Navigator Gordy Clement, Cardiac Imaging Nurse Navigator [facility] Direct Office Dial: [phone]   For scheduling needs, including cancellations and rescheduling, please call [place], [phone].    Follow-Up: At  [facility], you and your health needs are our priority.  As part of our continuing mission to provide you with exceptional heart care, we have created designated Provider Care Teams.  These Care Teams include your primary Cardiologist (physician) and Advanced Practice Providers (APPs -  Physician Assistants and Nurse Practitioners) who all work together to provide you with the care you need, when you need it.  We recommend signing up for the patient  portal called "MyChart".  Sign up information is provided on this After Visit Summary.  MyChart is used to connect with patients for Virtual Visits (Telemedicine).  Patients are able to view lab/test results, encounter notes, upcoming appointments, etc.  Non-urgent messages can be sent to your provider as well.   To learn more about what you can do with MyChart, go to [email or web address].    Your next appointment:   7 weeks  The format for your next appointment:   In Person  Provider:   Kate Sable, MD   Other Instructions

## 2021-03-17 LAB — BASIC METABOLIC PANEL
BUN/Creatinine Ratio: 22 (ref 12–28)
BUN: 15 mg/dL (ref 8–27)
CO2: 23 mmol/L (ref 20–29)
Calcium: 9.5 mg/dL (ref 8.7–10.3)
Chloride: 100 mmol/L (ref 96–106)
Creatinine, Ser: 0.68 mg/dL (ref 0.57–1.00)
Glucose: 92 mg/dL (ref 65–99)
Potassium: 4.5 mmol/L (ref 3.5–5.2)
Sodium: 137 mmol/L (ref 134–144)
eGFR: 97 mL/min/{1.73_m2} (ref >59)

## 2021-03-20 ENCOUNTER — Telehealth: Payer: Self-pay | Admitting: Cardiology

## 2021-03-20 NOTE — Telephone Encounter (Signed)
Patient returning call Patient not sure who called  Please call if needed

## 2021-04-04 ENCOUNTER — Telehealth (HOSPITAL_COMMUNITY): Payer: Self-pay | Admitting: *Deleted

## 2021-04-04 NOTE — Telephone Encounter (Signed)
Attempted to call patient regarding upcoming cardiac CT appointment. °Left message on voicemail with name and callback number ° °Narelle Schoening RN Navigator Cardiac Imaging °[facility] °[phone] Office °[phone] Cell ° °

## 2021-04-05 ENCOUNTER — Ambulatory Visit
Admission: RE | Admit: 2021-04-05 | Discharge: 2021-04-05 | Disposition: A | Discharge status: Home / Self Care | Payer: Medicare Other | Source: Ambulatory Visit | Attending: Cardiology | Admitting: Cardiology

## 2021-04-05 ENCOUNTER — Ambulatory Visit (INDEPENDENT_AMBULATORY_CARE_PROVIDER_SITE_OTHER): Payer: Medicare Other

## 2021-04-05 ENCOUNTER — Other Ambulatory Visit: Payer: Self-pay

## 2021-04-05 DIAGNOSIS — R072 Precordial pain: Secondary | ICD-10-CM | POA: Insufficient documentation

## 2021-04-05 LAB — ECHOCARDIOGRAM COMPLETE
Area-P 1/2: 3.37 cm2
S' Lateral: 3.25 cm

## 2021-04-05 MED ORDER — NITROGLYCERIN 0.4 MG SL SUBL
0.8000 mg | SUBLINGUAL_TABLET | Freq: Once | SUBLINGUAL | Status: AC
  Administered 2021-04-05: 0.8 mg via SUBLINGUAL

## 2021-04-05 MED ORDER — IOHEXOL 350 MG/ML SOLN
100.0000 mL | Freq: Once | INTRAVENOUS | Status: AC | PRN
  Administered 2021-04-05: 85 mL via INTRAVENOUS

## 2021-04-05 NOTE — Progress Notes (Signed)
Patient tolerated CT well. Drank water after. Vital signs stable encourage to drink water throughout day.Reasons explained and verbalized understanding. Ambulated steady gait.  

## 2021-04-06 ENCOUNTER — Telehealth: Payer: Self-pay

## 2021-04-06 MED ORDER — ASPIRIN 81 MG PO TBEC: 81.0000 mg | DELAYED_RELEASE_TABLET | Freq: Every day | ORAL | 0 refills | Status: DC

## 2021-04-06 NOTE — Telephone Encounter (Signed)
Janan Ridge, [place]  04/06/2021 12:53 PM EDT Back to Top     No answer. LMOV. Phone Note Started.    Kate Sable, MD  04/05/2021 7:16 PM EDT      Normal systolic and diastolic function, no findings to suggest etiology of chest pain.

## 2021-04-06 NOTE — Telephone Encounter (Signed)
The patient has been notified of the result and verbalized understanding.  All questions (if any) were answered. Janan Ridge, [place] 04/06/2021 1:43 PM

## 2021-04-06 NOTE — Addendum Note (Signed)
Addended by: Janan Ridge on: 04/06/2021 01:45 PM   Modules accepted: Orders

## 2021-05-03 ENCOUNTER — Encounter: Payer: Self-pay | Admitting: Cardiology

## 2021-05-03 ENCOUNTER — Other Ambulatory Visit: Payer: Self-pay

## 2021-05-03 ENCOUNTER — Ambulatory Visit: Payer: Medicare Other | Admitting: Cardiology

## 2021-05-03 VITALS — BP 118/74 | HR 69 | Ht 63.0 in | Wt 188.0 lb

## 2021-05-03 DIAGNOSIS — F17200 Nicotine dependence, unspecified, uncomplicated: Secondary | ICD-10-CM

## 2021-05-03 DIAGNOSIS — E7800 Pure hypercholesterolemia, unspecified: Secondary | ICD-10-CM

## 2021-05-03 DIAGNOSIS — I2510 Atherosclerotic heart disease of native coronary artery without angina pectoris: Secondary | ICD-10-CM | POA: Diagnosis not present

## 2021-05-03 MED ORDER — CLOPIDOGREL BISULFATE 75 MG PO TABS: 75.0000 mg | ORAL_TABLET | Freq: Every day | ORAL | 5 refills | Status: DC

## 2021-05-03 NOTE — Progress Notes (Signed)
Cardiology Office Note:    Date:  05/03/2021   ID:  Megan Nichols, DOB [birth year], MRN [id number]  PCP:  Megan Nichols, [place]  Cardiologist:  Megan Sable, MD  Advanced Practice Provider:  No care team member to display Electrophysiologist:  None       Referring MD: Megan Late, MD   Chief Complaint  Patient presents with  . Other    7 week follow up post Testing. Meds reviewed verbally with patient.     History of Present Illness:    Megan Nichols is a 65 y.o. female with a hx of anxiety, hyperlipidemia, family history of early CAD, current smoker x30 years, who presents for follow-up.  Previously seeing due to chest pain ongoing x3 days prompting her to go to the emergency room.  Due to risk factors, coronary CTA and echocardiogram was ordered.  Echo was normal, coronary CTA showed mild nonobstructive proximal LAD disease.  States chest pain symptoms have improved.  Patient was started on aspirin 81 mg but developed an allergy with foaming in her mouth.  Aspirin was stopped with resolution of symptoms.  States having previous statin allergies, started on Pravachol 40 mg daily, patient is tolerating.  Prior notes Echocardiogram 07/01/5328, normal systolic and diastolic function, EF 55 to 60% Coronary CTA 04/05/2021, calcium score 192, mild nonobstructive CAD proximal LAD stenosis. Previous statin intolerance, currently tolerating Pravachol  Past Medical History:  Diagnosis Date  . Allergy   . Anxiety   . Arthritis   . Degeneration of intervertebral disc of lumbar region   . Depression   . Hyperlipidemia   . Impaired fasting glucose 05/21/2016  . Irritable bowel syndrome with constipation   . Obesity, Class I, BMI 30.0-34.9 (see actual BMI)   . Pelvic floor dysfunction   . Tobacco use 12/26/2015    Past Surgical History:  Procedure Laterality Date  . APPENDECTOMY    . BLADDER SURGERY    . BREAST CYST ASPIRATION Left     1999  . HEMORRHOID SURGERY    . RECTOCELE REPAIR N/A 10/09/2018   Procedure: POSTERIOR REPAIR (RECTOCELE);  Surgeon: Schermerhorn, Gwen Her, MD;  Location: [facility] ORS;  Service: Gynecology;  Laterality: N/A;  . TONSILLECTOMY      Current Medications: Current Meds  Medication Sig  . Azelastine-Fluticasone 137-50 MCG/ACT SUSP Dymista 137 mcg-50 mcg/spray nasal spray  . Cholecalciferol 125 MCG (5000 UT) TABS Take 5,000 Units by mouth daily.  . clopidogrel (PLAVIX) 75 MG tablet Take 1 tablet (75 mg total) by mouth daily.  . meloxicam (MOBIC) 15 MG tablet Take 1 tablet (15 mg total) by mouth daily.  . pravastatin (PRAVACHOL) 40 MG tablet Take 1 tablet (40 mg total) by mouth every evening.  Marland Kitchen tiZANidine (ZANAFLEX) 2 MG tablet Take 2 mg by mouth at bedtime.     Allergies:   Aspirin   Social History   Socioeconomic History  . Marital status: Single    Spouse name: Not on file  . Number of children: Not on file  . Years of education: Not on file  . Highest education level: Not on file  Occupational History  . Not on file  Tobacco Use  . Smoking status: Current Every Day Smoker    Packs/day: 1.00    Years: 30.00    Pack years: 30.00    Types: Cigarettes  . Smokeless tobacco: Never Used  Vaping Use  . Vaping Use: Never used  Substance  and Sexual Activity  . Alcohol use: Never    Alcohol/week: 0.0 standard drinks  . Drug use: No  . Sexual activity: Not Currently  Other Topics Concern  . Not on file  Social History Narrative  . Not on file   Social Determinants of Health   Financial Resource Strain: Not on file  Food Insecurity: Not on file  Transportation Needs: Not on file  Physical Activity: Not on file  Stress: Not on file  Social Connections: Not on file     Family History: The patient's family history includes AAA (abdominal aortic aneurysm) in her mother; Cancer in her father and maternal grandfather; Diabetes in her father and paternal grandmother; Heart attack  in her paternal grandfather; Heart disease in her father and maternal grandmother; Hypertension in her father, sister, sister, and sister; Hypothyroidism in her father, sister, sister, and sister; Intracerebral hemorrhage in her mother; Leukemia in her father; Stroke in her maternal grandmother and mother. There is no history of Breast cancer.  ROS:   Please see the history of present illness.     All other systems reviewed and are negative.  EKGs/Labs/Other Studies Reviewed:    The following studies were reviewed today:   EKG:  EKG not  ordered today.   Recent Labs: 03/13/2021: ALT 15; Hemoglobin 14.3; Platelets 264 03/16/2021: BUN 15; Creatinine, Ser 0.68; Potassium 4.5; Sodium 137  Recent Lipid Panel    Component Value Date/Time   CHOL 232 (H) 07/03/2016 0814   TRIG 73 07/03/2016 0814   HDL 76 07/03/2016 0814   CHOLHDL 3.3 11/17/2015 0856   LDLCALC 141 (H) 07/03/2016 0814     Risk Assessment/Calculations:      Physical Exam:    VS:  BP 118/74 (BP Location: Left Arm, Patient Position: Sitting, Cuff Size: Normal)   Pulse 69   Ht 5\' 3"  (1.6 m)   Wt 188 lb (85.3 kg)   SpO2 98%   BMI 33.30 kg/m     Wt Readings from Last 3 Encounters:  05/03/21 188 lb (85.3 kg)  03/16/21 186 lb (84.4 kg)  03/13/21 185 lb (83.9 kg)     GEN:  Well nourished, well developed in no acute distress HEENT: Normal NECK: No JVD; No carotid bruits LYMPHATICS: No lymphadenopathy CARDIAC: RRR, no murmurs, rubs, gallops RESPIRATORY:  Clear to auscultation without rales, wheezing or rhonchi  ABDOMEN: Soft, non-tender, non-distended MUSCULOSKELETAL:  No edema; No deformity  SKIN: Warm and dry NEUROLOGIC:  Alert and oriented x 3 PSYCHIATRIC:  Normal affect   ASSESSMENT:    1. Coronary artery disease involving native coronary artery of native heart without angina pectoris   2. Pure hypercholesterolemia   3. Smoking    PLAN:    In order of problems listed above:  1. CAD, mild  nonobstructive disease in the proximal LAD.  Echocardiogram 03/2021 showed normal systolic and diastolic function.Coronary CTA obtained 04/05/2021 showed mild proximal LAD stenosis, calcium score 192.  Currently denies chest pain.  Not tolerant to aspirin.  Start Plavix 75 mg daily, continue Pravachol 40 mg daily. 2. Hyperlipidemia, goal LDL less than 70.  Continue Pravachol 40 mg daily.  Repeat fasting lipid profile in 6 weeks 3. Current smoker, cessation again advised.  Follow-up in 6 weeks after repeat lipid panel.     Medication Adjustments/Labs and Tests Ordered: Current medicines are reviewed at length with the patient today.  Concerns regarding medicines are outlined above.  Orders Placed This Encounter  Procedures  . Lipid panel  Meds ordered this encounter  Medications  . clopidogrel (PLAVIX) 75 MG tablet    Sig: Take 1 tablet (75 mg total) by mouth daily.    Dispense:  30 tablet    Refill:  5    Patient Instructions  Medication Instructions:   1. STOP taking Aspirin.  2.  START taking Plavix 75 MG once a day.  *If you need a refill on your cardiac medications before your next appointment, please call your pharmacy*   Lab Work:  Your physician recommends that you return for a FASTING lipid profile: in 6 weeks - You will need to be fasting. Please do not have anything to eat or drink after midnight the morning you have the lab work. You may only have water or black coffee with no cream or sugar. - Please go to the [facility]. You will check in at the front desk to the right as you walk into the atrium. Valet Parking is offered if needed. - No appointment needed. You may go any day between 7 am and 6 pm.  Testing/Procedures:  None ordered   Follow-Up: At [facility], you and your health needs are our priority.  As part of our continuing mission to provide you with exceptional heart care, we have created designated Provider Care Teams.  These Care Teams  include your primary Cardiologist (physician) and Advanced Practice Providers (APPs -  Physician Assistants and Nurse Practitioners) who all work together to provide you with the care you need, when you need it.  We recommend signing up for the patient portal called "MyChart".  Sign up information is provided on this After Visit Summary.  MyChart is used to connect with patients for Virtual Visits (Telemedicine).  Patients are able to view lab/test results, encounter notes, upcoming appointments, etc.  Non-urgent messages can be sent to your provider as well.   To learn more about what you can do with MyChart, go to [email or web address].    Your next appointment:   6 week(s)  The format for your next appointment:   In Person  Provider:   Kate Sable, MD   Other Instructions      Signed, Megan Sable, MD  05/03/2021 12:13 PM    [place]

## 2021-05-03 NOTE — Patient Instructions (Signed)
Medication Instructions:   1. STOP taking Aspirin.  2.  START taking Plavix 75 MG once a day.  *If you need a refill on your cardiac medications before your next appointment, please call your pharmacy*   Lab Work:  Your physician recommends that you return for a FASTING lipid profile: in 6 weeks - You will need to be fasting. Please do not have anything to eat or drink after midnight the morning you have the lab work. You may only have water or black coffee with no cream or sugar. - Please go to the [facility]. You will check in at the front desk to the right as you walk into the atrium. Valet Parking is offered if needed. - No appointment needed. You may go any day between 7 am and 6 pm.  Testing/Procedures:  None ordered   Follow-Up: At [facility], you and your health needs are our priority.  As part of our continuing mission to provide you with exceptional heart care, we have created designated Provider Care Teams.  These Care Teams include your primary Cardiologist (physician) and Advanced Practice Providers (APPs -  Physician Assistants and Nurse Practitioners) who all work together to provide you with the care you need, when you need it.  We recommend signing up for the patient portal called "MyChart".  Sign up information is provided on this After Visit Summary.  MyChart is used to connect with patients for Virtual Visits (Telemedicine).  Patients are able to view lab/test results, encounter notes, upcoming appointments, etc.  Non-urgent messages can be sent to your provider as well.   To learn more about what you can do with MyChart, go to [email or web address].    Your next appointment:   6 week(s)  The format for your next appointment:   In Person  Provider:   Kate Sable, MD   Other Instructions

## 2021-06-14 ENCOUNTER — Other Ambulatory Visit
Admission: RE | Admit: 2021-06-14 | Discharge: 2021-06-14 | Disposition: A | Discharge status: Home / Self Care | Payer: Medicare Other | Attending: Cardiology | Admitting: Cardiology

## 2021-06-14 DIAGNOSIS — E7800 Pure hypercholesterolemia, unspecified: Secondary | ICD-10-CM | POA: Diagnosis present

## 2021-06-14 LAB — LIPID PANEL
Cholesterol: 214 mg/dL — ABNORMAL HIGH (ref 0–200)
HDL: 66 mg/dL (ref >40)
LDL Cholesterol: 117 mg/dL — ABNORMAL HIGH (ref 0–99)
Total CHOL/HDL Ratio: 3.2 RATIO
Triglycerides: 154 mg/dL — ABNORMAL HIGH (ref <150)
VLDL: 31 mg/dL (ref 0–40)

## 2021-06-15 NOTE — Telephone Encounter (Signed)
error 

## 2021-06-15 NOTE — Telephone Encounter (Signed)
-----   Message from Kate Sable, MD sent at 06/15/2021 12:58 PM EDT ----- Cholesterol levels still elevated, not at goal.  Increase Pravachol to 80 mg daily.

## 2021-06-18 ENCOUNTER — Encounter: Payer: Self-pay | Admitting: Cardiology

## 2021-06-18 ENCOUNTER — Ambulatory Visit: Payer: Medicare Other | Admitting: Cardiology

## 2021-06-18 ENCOUNTER — Other Ambulatory Visit: Payer: Self-pay

## 2021-06-18 VITALS — BP 126/70 | HR 70 | Ht 63.0 in | Wt 189.0 lb

## 2021-06-18 DIAGNOSIS — F17200 Nicotine dependence, unspecified, uncomplicated: Secondary | ICD-10-CM | POA: Diagnosis not present

## 2021-06-18 DIAGNOSIS — I2510 Atherosclerotic heart disease of native coronary artery without angina pectoris: Secondary | ICD-10-CM | POA: Diagnosis not present

## 2021-06-18 DIAGNOSIS — IMO0001 Reserved for inherently not codable concepts without codable children: Secondary | ICD-10-CM

## 2021-06-18 DIAGNOSIS — E7800 Pure hypercholesterolemia, unspecified: Secondary | ICD-10-CM | POA: Diagnosis not present

## 2021-06-18 MED ORDER — PRAVASTATIN SODIUM 80 MG PO TABS: 40.0000 mg | ORAL_TABLET | Freq: Every evening | ORAL | 3 refills | Status: DC

## 2021-06-18 NOTE — Progress Notes (Signed)
Cardiology Office Note:    Date:  06/18/2021   ID:  Megan Nichols, DOB [birth year], MRN [id number]  PCP:  Derinda Late, [place]  Cardiologist:  Kate Sable, MD  Advanced Practice Provider:  No care team member to display Electrophysiologist:  None       Referring MD: Derinda Late, MD   Chief Complaint  Patient presents with   Other    6 week follow up. Patient is not happy with the bruising she has while on plavix. Meds reviewed verbally with patient.     History of Present Illness:    Megan Nichols is a 65 y.o. female with a hx of anxiety, mild nonobstructive CAD, hyperlipidemia, family history of early CAD, current smoker x30 years, who presents for follow-up.    Being seen for CAD and hyperlipidemia.  Cholesterol levels on last lipid panel not controlled.  Was started on Plavix due to aspirin allergies.  She notes easy bruising and bleeding while on Plavix.  Would really like to stop.  Takes Pravachol 40 mg daily as prescribed.   Prior notes Echocardiogram 09/02/7653, normal systolic and diastolic function, EF 55 to 60% Coronary CTA 04/05/2021, calcium score 192, mild nonobstructive CAD proximal LAD stenosis. Previous statin intolerance, currently tolerating Pravachol  Past Medical History:  Diagnosis Date   Allergy    Anxiety    Arthritis    Degeneration of intervertebral disc of lumbar region    Depression    Hyperlipidemia    Impaired fasting glucose 05/21/2016   Irritable bowel syndrome with constipation    Obesity, Class I, BMI 30.0-34.9 (see actual BMI)    Pelvic floor dysfunction    Tobacco use 12/26/2015    Past Surgical History:  Procedure Laterality Date   APPENDECTOMY     BLADDER SURGERY     BREAST CYST ASPIRATION Left    1999   HEMORRHOID SURGERY     RECTOCELE REPAIR N/A 10/09/2018   Procedure: POSTERIOR REPAIR (RECTOCELE);  Surgeon: Schermerhorn, Gwen Her, MD;  Location: [facility] ORS;  Service:  Gynecology;  Laterality: N/A;   TONSILLECTOMY      Current Medications: Current Meds  Medication Sig   Azelastine-Fluticasone 137-50 MCG/ACT SUSP Dymista 137 mcg-50 mcg/spray nasal spray   Cholecalciferol 125 MCG (5000 UT) TABS Take 5,000 Units by mouth daily.   meloxicam (MOBIC) 15 MG tablet Take 15 mg by mouth as needed for pain.   tiZANidine (ZANAFLEX) 2 MG tablet Take 2 mg by mouth at bedtime.   [DISCONTINUED] clopidogrel (PLAVIX) 75 MG tablet Take 1 tablet (75 mg total) by mouth daily.   [DISCONTINUED] pravastatin (PRAVACHOL) 40 MG tablet Take 1 tablet (40 mg total) by mouth every evening.     Allergies:   Aspirin   Social History   Socioeconomic History   Marital status: Single    Spouse name: Not on file   Number of children: Not on file   Years of education: Not on file   Highest education level: Not on file  Occupational History   Not on file  Tobacco Use   Smoking status: Every Day    Packs/day: 1.00    Years: 30.00    Pack years: 30.00    Types: Cigarettes   Smokeless tobacco: Never  Vaping Use   Vaping Use: Never used  Substance and Sexual Activity   Alcohol use: Never    Alcohol/week: 0.0 standard drinks   Drug use: No   Sexual activity:  Not Currently  Other Topics Concern   Not on file  Social History Narrative   Not on file   Social Determinants of Health   Financial Resource Strain: Not on file  Food Insecurity: Not on file  Transportation Needs: Not on file  Physical Activity: Not on file  Stress: Not on file  Social Connections: Not on file     Family History: The patient's family history includes AAA (abdominal aortic aneurysm) in her mother; Cancer in her father and maternal grandfather; Diabetes in her father and paternal grandmother; Heart attack in her paternal grandfather; Heart disease in her father and maternal grandmother; Hypertension in her father, sister, sister, and sister; Hypothyroidism in her father, sister, sister, and  sister; Intracerebral hemorrhage in her mother; Leukemia in her father; Stroke in her maternal grandmother and mother. There is no history of Breast cancer.  ROS:   Please see the history of present illness.     All other systems reviewed and are negative.  EKGs/Labs/Other Studies Reviewed:    The following studies were reviewed today:   EKG:  EKG not  ordered today.   Recent Labs: 03/13/2021: ALT 15; Hemoglobin 14.3; Platelets 264 03/16/2021: BUN 15; Creatinine, Ser 0.68; Potassium 4.5; Sodium 137  Recent Lipid Panel    Component Value Date/Time   CHOL 214 (H) 06/14/2021 0722   CHOL 232 (H) 07/03/2016 0814   TRIG 154 (H) 06/14/2021 0722   HDL 66 06/14/2021 0722   HDL 76 07/03/2016 0814   CHOLHDL 3.2 06/14/2021 0722   VLDL 31 06/14/2021 0722   LDLCALC 117 (H) 06/14/2021 0722   LDLCALC 141 (H) 07/03/2016 0814     Risk Assessment/Calculations:      Physical Exam:    VS:  BP 126/70 (BP Location: Left Arm, Patient Position: Sitting, Cuff Size: Normal)   Pulse 70   Ht 5\' 3"  (1.6 m)   Wt 189 lb (85.7 kg)   SpO2 96%   BMI 33.48 kg/m     Wt Readings from Last 3 Encounters:  06/18/21 189 lb (85.7 kg)  05/03/21 188 lb (85.3 kg)  03/16/21 186 lb (84.4 kg)     GEN:  Well nourished, well developed in no acute distress HEENT: Normal NECK: No JVD; No carotid bruits LYMPHATICS: No lymphadenopathy CARDIAC: RRR, no murmurs, rubs, gallops RESPIRATORY:  Clear to auscultation without rales, wheezing or rhonchi  ABDOMEN: Soft, non-tender, non-distended MUSCULOSKELETAL:  No edema; No deformity  SKIN: Warm and dry NEUROLOGIC:  Alert and oriented x 3 PSYCHIATRIC:  Normal affect   ASSESSMENT:    1. Coronary artery disease involving native coronary artery of native heart without angina pectoris   2. Pure hypercholesterolemia   3. Smoking     PLAN:    In order of problems listed above:  CAD, mild nonobstructive disease in the proximal LAD.  Echo 03/2021 showed normal  systolic and diastolic function.Coronary CTA 03/2021 - mild proximal LAD stenosis, calcium score 192.  Currently denies chest pain.  Not tolerant to aspirin, Plavix causing bruising.  Stop Plavix, increase Pravachol to 80 mg daily.   Hyperlipidemia, LDL not at goal.  Increase Pravachol to 80 mg daily.  Repeat fasting lipid profile in 6 weeks.  If cholesterol not controlled, will consider PCSK9.  She has allergies to other statins. Current smoker, cessation advised.  Follow-up in 3 months.     Medication Adjustments/Labs and Tests Ordered: Current medicines are reviewed at length with the patient today.  Concerns regarding medicines are  outlined above.  Orders Placed This Encounter  Procedures   Lipid panel    Meds ordered this encounter  Medications   pravastatin (PRAVACHOL) 80 MG tablet    Sig: Take 0.5 tablets (40 mg total) by mouth every evening.    Dispense:  30 tablet    Refill:  3     Patient Instructions  Medication Instructions:     Your physician has recommended you make the following change in your medication:    STOP taking your Plavix.  2.  INCREASE your Pravachol to 80 MG once a day.   *If you need a refill on your cardiac medications before your next appointment, please call your pharmacy*   Lab Work:  Your physician recommends that you return for a FASTING lipid profile:  IN 6 WEEKS  - You will need to be fasting. Please do not have anything to eat or drink after midnight the morning you have the lab work. You may only have water or black coffee with no cream or sugar.  - Please go to the [facility]. You will check in at the front desk to the right as you walk into the atrium. Valet Parking is offered if needed. - No appointment needed. You may go any day between 7 am and 6 pm.    Testing/Procedures: None ordereed   Follow-Up: At [facility], you and your health needs are our priority.  As part of our continuing mission to provide you with  exceptional heart care, we have created designated Provider Care Teams.  These Care Teams include your primary Cardiologist (physician) and Advanced Practice Providers (APPs -  Physician Assistants and Nurse Practitioners) who all work together to provide you with the care you need, when you need it.  We recommend signing up for the patient portal called "MyChart".  Sign up information is provided on this After Visit Summary.  MyChart is used to connect with patients for Virtual Visits (Telemedicine).  Patients are able to view lab/test results, encounter notes, upcoming appointments, etc.  Non-urgent messages can be sent to your provider as well.   To learn more about what you can do with MyChart, go to [email or web address].    Your next appointment:   3 month(s)  The format for your next appointment:   In Person  Provider:   Kate Sable, MD ONLY    Other Instructions    Signed, Kate Sable, MD  06/18/2021 5:04 PM    [place]

## 2021-06-18 NOTE — Patient Instructions (Signed)
Medication Instructions:     Your physician has recommended you make the following change in your medication:    STOP taking your Plavix.  2.  INCREASE your Pravachol to 80 MG once a day.   *If you need a refill on your cardiac medications before your next appointment, please call your pharmacy*   Lab Work:  Your physician recommends that you return for a FASTING lipid profile:  IN 6 WEEKS  - You will need to be fasting. Please do not have anything to eat or drink after midnight the morning you have the lab work. You may only have water or black coffee with no cream or sugar.  - Please go to the [facility]. You will check in at the front desk to the right as you walk into the atrium. Valet Parking is offered if needed. - No appointment needed. You may go any day between 7 am and 6 pm.    Testing/Procedures: None ordereed   Follow-Up: At [facility], you and your health needs are our priority.  As part of our continuing mission to provide you with exceptional heart care, we have created designated Provider Care Teams.  These Care Teams include your primary Cardiologist (physician) and Advanced Practice Providers (APPs -  Physician Assistants and Nurse Practitioners) who all work together to provide you with the care you need, when you need it.  We recommend signing up for the patient portal called "MyChart".  Sign up information is provided on this After Visit Summary.  MyChart is used to connect with patients for Virtual Visits (Telemedicine).  Patients are able to view lab/test results, encounter notes, upcoming appointments, etc.  Non-urgent messages can be sent to your provider as well.   To learn more about what you can do with MyChart, go to [email or web address].    Your next appointment:   3 month(s)  The format for your next appointment:   In Person  Provider:   Kate Sable, MD ONLY    Other Instructions

## 2021-07-02 ENCOUNTER — Other Ambulatory Visit: Payer: Self-pay

## 2021-07-02 ENCOUNTER — Encounter: Payer: Self-pay | Admitting: Emergency Medicine

## 2021-07-02 ENCOUNTER — Emergency Department
Admission: EM | Admit: 2021-07-02 | Discharge: 2021-07-02 | Disposition: A | Discharge status: Home / Self Care | Payer: Medicare Other | Attending: Emergency Medicine | Admitting: Emergency Medicine

## 2021-07-02 ENCOUNTER — Emergency Department: Payer: Medicare Other

## 2021-07-02 DIAGNOSIS — R0602 Shortness of breath: Secondary | ICD-10-CM | POA: Diagnosis present

## 2021-07-02 DIAGNOSIS — U071 COVID-19: Secondary | ICD-10-CM | POA: Diagnosis not present

## 2021-07-02 DIAGNOSIS — I10 Essential (primary) hypertension: Secondary | ICD-10-CM | POA: Insufficient documentation

## 2021-07-02 DIAGNOSIS — I2510 Atherosclerotic heart disease of native coronary artery without angina pectoris: Secondary | ICD-10-CM | POA: Insufficient documentation

## 2021-07-02 DIAGNOSIS — Z0000 Encounter for general adult medical examination without abnormal findings: Secondary | ICD-10-CM | POA: Diagnosis not present

## 2021-07-02 DIAGNOSIS — R531 Weakness: Secondary | ICD-10-CM

## 2021-07-02 DIAGNOSIS — Z79899 Other long term (current) drug therapy: Secondary | ICD-10-CM | POA: Diagnosis not present

## 2021-07-02 DIAGNOSIS — Z139 Encounter for screening, unspecified: Secondary | ICD-10-CM

## 2021-07-02 DIAGNOSIS — F17210 Nicotine dependence, cigarettes, uncomplicated: Secondary | ICD-10-CM | POA: Diagnosis not present

## 2021-07-02 LAB — BASIC METABOLIC PANEL
Anion gap: 10 (ref 5–15)
BUN: 15 mg/dL (ref 8–23)
CO2: 25 mmol/L (ref 22–32)
Calcium: 8.6 mg/dL — ABNORMAL LOW (ref 8.9–10.3)
Chloride: 102 mmol/L (ref 98–111)
Creatinine, Ser: 0.59 mg/dL (ref 0.44–1.00)
GFR, Estimated: >60 mL/min (ref >60)
Glucose, Bld: 90 mg/dL (ref 70–99)
Potassium: 3.7 mmol/L (ref 3.5–5.1)
Sodium: 137 mmol/L (ref 135–145)

## 2021-07-02 LAB — TROPONIN I (HIGH SENSITIVITY): Troponin I (High Sensitivity): 4 ng/L (ref <18)

## 2021-07-02 LAB — CBC
HCT: 42.9 % (ref 36.0–46.0)
Hemoglobin: 14.5 g/dL (ref 12.0–15.0)
MCH: 31.3 pg (ref 26.0–34.0)
MCHC: 33.8 g/dL (ref 30.0–36.0)
MCV: 92.7 fL (ref 80.0–100.0)
Platelets: 277 10*3/uL (ref 150–400)
RBC: 4.63 MIL/uL (ref 3.87–5.11)
RDW: 13.4 % (ref 11.5–15.5)
WBC: 12.7 10*3/uL — ABNORMAL HIGH (ref 4.0–10.5)
nRBC: 0 % (ref 0.0–0.2)

## 2021-07-02 NOTE — ED Notes (Signed)
Pt a/o, sitting in chair, no s/s of distress.

## 2021-07-02 NOTE — ED Triage Notes (Signed)
Pt arrived via POV with reports of continued COVID sxs. Pt states she was diagnosed with COVID at the walk-in clinic next door on 6/24.  Pt states she followed up with PCP 7 days later and was dx with bronchitis. Pt states she has been on her second round of steroids and 1st round of antibiotics (Augmentin)  Pt c/o productive cough, congestion, weakness, shortness of breath with activity.  Pt states her initial covid sxs began 6/24.   Pt has had 3 COVID vaccines.

## 2021-07-02 NOTE — ED Provider Notes (Signed)
[facility] Emergency Department Provider Note ____________________________________________   Event Date/Time   First MD Initiated Contact with Patient 07/02/21 0840     (approximate)  I have reviewed the triage vital signs and the nursing notes.  HISTORY  Chief Complaint Shortness of Breath   HPI Megan Nichols is a 65 y.o. femalewho presents to the ED for evaluation of subacute generalized weakness, cough and shortness of breath.  Chart review indicates patient anxiety, obesity, cigarette smoking, mild nonobstructive CAD undergoing surveillance with cardiology.  HTN, HLD.  Patient reports being diagnosed with COVID-19 on 6/24 and has had symptoms since around that time.  She reports generalized weakness, myalgias, shortness of breath and cough increased from baseline.  She reports going back to an urgent care last week and was prescribed prednisone and Augmentin for bronchitis.  She reports that she has been compliant with his medications.  She reports continued cigarette smoking.  She reports that she lives at home alone.  She reports that she has not felt any better and presents this morning to the ED for evaluation of why she is not better yet.  She reports poor appetite and decreased taste/smell.  She denies any abdominal pain, chest pain, syncopal episodes, falls or injuries.  Denies diarrhea, dysuria.  Past Medical History:  Diagnosis Date   Allergy    Anxiety    Arthritis    Degeneration of intervertebral disc of lumbar region    Depression    Hyperlipidemia    Impaired fasting glucose 05/21/2016   Irritable bowel syndrome with constipation    Obesity, Class I, BMI 30.0-34.9 (see actual BMI)    Pelvic floor dysfunction    Tobacco use 12/26/2015    Patient Active Problem List   Diagnosis Date Noted   Postoperative state 10/09/2018   Varicose veins of both lower extremities with pain 05/09/2018   Chronic venous insufficiency 05/09/2018    Impaired fasting glucose 05/21/2016   Obesity 05/21/2016   Encounter for smoking cessation counseling 12/26/2015   Tobacco use 12/26/2015   Cervical disc disease 11/17/2015   Other fatigue 11/17/2015   Arthritis 11/17/2015   Degeneration of intervertebral disc of lumbar region 11/17/2015   Anxiety and depression 11/17/2015   Irritable bowel syndrome with constipation 11/17/2015   Need for pneumococcal vaccination 11/17/2015   Hyperlipidemia LDL goal <100 07/14/2014    Past Surgical History:  Procedure Laterality Date   APPENDECTOMY     BLADDER SURGERY     BREAST CYST ASPIRATION Left    1999   HEMORRHOID SURGERY     RECTOCELE REPAIR N/A 10/09/2018   Procedure: POSTERIOR REPAIR (RECTOCELE);  Surgeon: Schermerhorn, Gwen Her, MD;  Location: [facility] ORS;  Service: Gynecology;  Laterality: N/A;   TONSILLECTOMY      Prior to Admission medications   Medication Sig Start Date End Date Taking? Authorizing Provider  Azelastine-Fluticasone 137-50 MCG/ACT SUSP Dymista 137 mcg-50 mcg/spray nasal spray    [provider]  Cholecalciferol 125 MCG (5000 UT) TABS Take 5,000 Units by mouth daily.    [provider]  meloxicam (MOBIC) 15 MG tablet Take 15 mg by mouth as needed for pain.    [provider]  pravastatin (PRAVACHOL) 80 MG tablet Take 0.5 tablets (40 mg total) by mouth every evening. 06/18/21 09/16/21  Kate Sable, MD  tiZANidine (ZANAFLEX) 2 MG tablet Take 2 mg by mouth at bedtime. 02/19/21   [provider]    Allergies Aspirin  Family History  Problem Relation Age of Onset   Diabetes Father    Heart disease Father    Hypothyroidism Father    Hypertension Father    Leukemia Father    Cancer Father    Intracerebral hemorrhage Mother    AAA (abdominal aortic aneurysm) Mother    Stroke Mother    Hypothyroidism Sister    Hypertension Sister    Stroke Maternal Grandmother    Heart disease Maternal Grandmother    Cancer Maternal  Grandfather        ?   Diabetes Paternal Grandmother    Heart attack Paternal Grandfather    Hypothyroidism Sister    Hypertension Sister    Hypothyroidism Sister    Hypertension Sister    Breast cancer Neg Hx     Social History Social History   Tobacco Use   Smoking status: Every Day    Packs/day: 1.00    Years: 30.00    Pack years: 30.00    Types: Cigarettes   Smokeless tobacco: Never  Vaping Use   Vaping Use: Never used  Substance Use Topics   Alcohol use: Never    Alcohol/week: 0.0 standard drinks   Drug use: No    Review of Systems  Constitutional: No fever/chills.  Positive for generalized weakness. Eyes: No visual changes. ENT: No sore throat. Cardiovascular: Denies chest pain. Respiratory: Positive for cough and shortness of breath. Gastrointestinal: No abdominal pain.  No nausea, no vomiting.  No diarrhea.  No constipation. Genitourinary: Negative for dysuria. Musculoskeletal: Negative for back pain. Skin: Negative for rash. Neurological: Negative for headaches, focal weakness or numbness  ____________________________________________   PHYSICAL EXAM:  VITAL SIGNS: Vitals:   07/02/21 0608  BP: 124/81  Pulse: 67  Resp: 19  Temp: 98.3 F (36.8 C)  SpO2: 93%    Constitutional: Alert and oriented. Well appearing and in no acute distress. Eyes: Conjunctivae are normal. PERRL. EOMI. Head: Atraumatic. Nose: No congestion/rhinnorhea. Mouth/Throat: Mucous membranes are moist.  Oropharynx non-erythematous. Neck: No stridor. No cervical spine tenderness to palpation. Cardiovascular: Normal rate, regular rhythm. Grossly normal heart sounds.  Good peripheral circulation. Respiratory: Normal respiratory effort.  No retractions. Lungs CTAB. Gastrointestinal: Soft , nondistended, nontender to palpation. No CVA tenderness. Musculoskeletal: No lower extremity tenderness nor edema.  No joint effusions. No signs of acute trauma. Neurologic:  Normal speech and  language. No gross focal neurologic deficits are appreciated. No gait instability noted. Skin:  Skin is warm, dry and intact. No rash noted. Psychiatric: Mood and affect are normal. Speech and behavior are normal. ____________________________________________   LABS (all labs ordered are listed, but only abnormal results are displayed)  Labs Reviewed  BASIC METABOLIC PANEL - Abnormal; Notable for the following components:      Result Value   Calcium 8.6 (*)    All other components within normal limits  CBC - Abnormal; Notable for the following components:   WBC 12.7 (*)    All other components within normal limits  TROPONIN I (HIGH SENSITIVITY)  TROPONIN I (HIGH SENSITIVITY)   ____________________________________________  12 Lead EKG  Sinus rhythm, rate of 68 bpm.  Normal axis and intervals.  No evidence of acute ischemia. ____________________________________________  RADIOLOGY  ED MD interpretation: 2 view CXR reviewed by me without evidence of acute cardiopulmonary pathology.  Official radiology report(s): DG Chest 2 View  Result Date: 07/02/2021 CLINICAL DATA:  65 year old female positive COVID-19. Shortness of breath. Smoker. EXAM: CHEST - 2 VIEW COMPARISON:  Chest CT 04/05/2021 and earlier.  FINDINGS: Lung volumes and mediastinal contours are stable since 2016 and within normal limits. Visualized tracheal air column is within normal limits. No pneumothorax, pulmonary edema, pleural effusion or confluent pulmonary opacity. Symmetric increased pulmonary interstitial markings are only mildly progressed since 2016 and stable from 03/13/2021 radiographs this year - likely smoking related. No acute osseous abnormality identified. Negative visible bowel gas pattern. IMPRESSION: No acute cardiopulmonary abnormality. Electronically Signed   By: Genevie Ann M.D.   On: 07/02/2021 07:25    ____________________________________________   PROCEDURES and INTERVENTIONS  Procedure(s) performed  (including Critical Care):  Procedures  Medications - No data to display  ____________________________________________   MDM / ED COURSE   65 year old woman presents to the ED with continued COVID-19 symptoms, amenable to outpatient management.  Normal vitals on room air.  Exam without evidence of acute derangements.  She looks clinically well without distress.  Clear lungs.  She is currently already on steroids and antibiotics.  CXR without infiltration and she has no evidence of ACS.  Blood work is largely unremarkable.  Suspect continued symptoms of viral infection.  Advised patient of smoking cessation, symptom control at home and return precautions for ED were discussed prior to discharge.     ____________________________________________   FINAL CLINICAL IMPRESSION(S) / ED DIAGNOSES  Final diagnoses:  Generalized weakness  COVID-19  Encounter for medical screening examination     ED Discharge Orders     None        Giulian Goldring Tamala Julian   Note:  This document was prepared using Dragon voice recognition software and may include unintentional dictation errors.    Vladimir Crofts, MD 07/02/21 [phone]

## 2021-08-14 ENCOUNTER — Encounter: Payer: Self-pay | Admitting: Obstetrics and Gynecology

## 2021-08-21 ENCOUNTER — Inpatient Hospital Stay: Payer: Medicare Other

## 2021-08-21 ENCOUNTER — Inpatient Hospital Stay: Payer: Medicare Other | Attending: Oncology | Admitting: Oncology

## 2021-08-21 ENCOUNTER — Encounter: Payer: Self-pay | Admitting: Oncology

## 2021-08-21 VITALS — BP 113/8 | HR 77 | Temp 98.3°F | Resp 16 | Ht 63.0 in | Wt 189.1 lb

## 2021-08-21 DIAGNOSIS — D751 Secondary polycythemia: Secondary | ICD-10-CM

## 2021-08-21 DIAGNOSIS — Z720 Tobacco use: Secondary | ICD-10-CM

## 2021-08-21 DIAGNOSIS — F17210 Nicotine dependence, cigarettes, uncomplicated: Secondary | ICD-10-CM | POA: Diagnosis not present

## 2021-08-21 LAB — CBC WITH DIFFERENTIAL/PLATELET
Abs Immature Granulocytes: 0.02 10*3/uL (ref 0.00–0.07)
Basophils Absolute: 0 10*3/uL (ref 0.0–0.1)
Basophils Relative: 1 %
Eosinophils Absolute: 0.1 10*3/uL (ref 0.0–0.5)
Eosinophils Relative: 2 %
HCT: 44.9 % (ref 36.0–46.0)
Hemoglobin: 15.3 g/dL — ABNORMAL HIGH (ref 12.0–15.0)
Immature Granulocytes: 0 %
Lymphocytes Relative: 37 %
Lymphs Abs: 2.4 10*3/uL (ref 0.7–4.0)
MCH: 31.4 pg (ref 26.0–34.0)
MCHC: 34.1 g/dL (ref 30.0–36.0)
MCV: 92 fL (ref 80.0–100.0)
Monocytes Absolute: 0.6 10*3/uL (ref 0.1–1.0)
Monocytes Relative: 9 %
Neutro Abs: 3.3 10*3/uL (ref 1.7–7.7)
Neutrophils Relative %: 51 %
Platelets: 232 10*3/uL (ref 150–400)
RBC: 4.88 MIL/uL (ref 3.87–5.11)
RDW: 12.9 % (ref 11.5–15.5)
WBC: 6.4 10*3/uL (ref 4.0–10.5)
nRBC: 0 % (ref 0.0–0.2)

## 2021-08-21 NOTE — Progress Notes (Signed)
Hematology/Oncology Consult note [facility] Telephone:([phone] [phone]   Patient Care Team: Derinda Late, MD as PCP - General (Family Medicine) Kate Sable, MD as PCP - Cardiology (Cardiology)  REFERRING PROVIDER: Schermerhorn, Gwen Her,*  CHIEF COMPLAINTS/REASON FOR VISIT:  Evaluation of elevated hemoglobin.  HISTORY OF PRESENTING ILLNESS:  Megan Nichols is a 65 y.o. female who was seen in consultation at the request of Schermerhorn, Gwen Her,* for evaluation of polycytosis/erythrocytosis Reviewed patient's recent lab work  08/09/2021 Labs showed elevated hemoglobin at 15.4,  total white count 7.4, platelet counts 251,000 New onset  No aggravating or alleviating factors.   Associated signs or symptoms: Denies weight loss, fever, chills, fatigue, night sweats.   Context:  Smoking history: Patient smokes about a pack a day for 30 years. History of blood clots: Denies  Patient follows up with cardiology for CAD, mild nonobstructive disease in the proximal LAD.  Hyperlipidemia,   Review of Systems  Constitutional:  Negative for appetite change, chills, fatigue and fever.  HENT:   Negative for hearing loss and voice change.   Eyes:  Negative for eye problems.  Respiratory:  Negative for chest tightness and cough.   Cardiovascular:  Negative for chest pain.  Gastrointestinal:  Negative for abdominal distention, abdominal pain and blood in stool.  Endocrine: Negative for hot flashes.  Genitourinary:  Negative for difficulty urinating and frequency.   Musculoskeletal:  Negative for arthralgias.  Skin:  Negative for itching and rash.  Neurological:  Negative for extremity weakness.  Hematological:  Negative for adenopathy.  Psychiatric/Behavioral:  Negative for confusion.    MEDICAL HISTORY:  Past Medical History:  Diagnosis Date   Allergy    Anxiety    Arthritis    Degeneration of intervertebral disc of lumbar region     Depression    Hyperlipidemia    Impaired fasting glucose 05/21/2016   Irritable bowel syndrome with constipation    Obesity, Class I, BMI 30.0-34.9 (see actual BMI)    Pelvic floor dysfunction    Tobacco use 12/26/2015    SURGICAL HISTORY: Past Surgical History:  Procedure Laterality Date   APPENDECTOMY     BLADDER SURGERY     BREAST CYST ASPIRATION Left    1999   HEMORRHOID SURGERY     RECTOCELE REPAIR N/A 10/09/2018   Procedure: POSTERIOR REPAIR (RECTOCELE);  Surgeon: Schermerhorn, Gwen Her, MD;  Location: [facility] ORS;  Service: Gynecology;  Laterality: N/A;   TONSILLECTOMY      SOCIAL HISTORY: Social History   Socioeconomic History   Marital status: Single    Spouse name: Not on file   Number of children: Not on file   Years of education: Not on file   Highest education level: Not on file  Occupational History   Not on file  Tobacco Use   Smoking status: Every Day    Packs/day: 1.00    Years: 30.00    Pack years: 30.00    Types: Cigarettes   Smokeless tobacco: Never  Vaping Use   Vaping Use: Never used  Substance and Sexual Activity   Alcohol use: Never    Alcohol/week: 0.0 standard drinks   Drug use: No   Sexual activity: Not Currently  Other Topics Concern   Not on file  Social History Narrative   Not on file   Social Determinants of Health   Financial Resource Strain: Not on file  Food Insecurity: Not on file  Transportation Needs: Not on file  Physical Activity:  Not on file  Stress: Not on file  Social Connections: Not on file  Intimate Partner Violence: Not on file    FAMILY HISTORY: Family History  Problem Relation Age of Onset   Diabetes Father    Heart disease Father    Hypothyroidism Father    Hypertension Father    Leukemia Father    Cancer Father    Intracerebral hemorrhage Mother    AAA (abdominal aortic aneurysm) Mother    Stroke Mother    Hypothyroidism Sister    Hypertension Sister    Stroke Maternal Grandmother    Heart  disease Maternal Grandmother    Cancer Maternal Grandfather        ?   Diabetes Paternal Grandmother    Heart attack Paternal Grandfather    Hypothyroidism Sister    Hypertension Sister    Hypothyroidism Sister    Hypertension Sister    Breast cancer Neg Hx     ALLERGIES:  is allergic to aspirin.  MEDICATIONS:  Current Outpatient Medications  Medication Sig Dispense Refill   CALCIUM-VITAMIN D PO Take by mouth. 2000 mg of vitamin D     Multiple Vitamins-Minerals (PRESERVISION AREDS 2 PO) Take by mouth.     pravastatin (PRAVACHOL) 80 MG tablet Take 0.5 tablets (40 mg total) by mouth every evening. 30 tablet 3   tiZANidine (ZANAFLEX) 2 MG tablet Take 2 mg by mouth at bedtime.     No current facility-administered medications for this visit.     PHYSICAL EXAMINATION: ECOG PERFORMANCE STATUS: 0 - Asymptomatic Vitals:   08/21/21 0933  BP: (!) 113/8  Pulse: 77  Resp: 16  Temp: 98.3 F (36.8 C)  SpO2: 98%   Filed Weights   08/21/21 0933  Weight: 189 lb 1.6 oz (85.8 kg)    Physical Exam Constitutional:      General: She is not in acute distress. HENT:     Head: Normocephalic and atraumatic.  Eyes:     General: No scleral icterus. Cardiovascular:     Rate and Rhythm: Normal rate and regular rhythm.     Heart sounds: Normal heart sounds.  Pulmonary:     Effort: Pulmonary effort is normal. No respiratory distress.     Breath sounds: No wheezing.  Abdominal:     General: Bowel sounds are normal. There is no distension.     Palpations: Abdomen is soft.  Musculoskeletal:        General: No deformity. Normal range of motion.     Cervical back: Normal range of motion and neck supple.  Skin:    General: Skin is warm and dry.     Findings: No erythema or rash.  Neurological:     Mental Status: She is alert and oriented to person, place, and time. Mental status is at baseline.     Cranial Nerves: No cranial nerve deficit.     Coordination: Coordination normal.   Psychiatric:        Mood and Affect: Mood normal.    RADIOGRAPHIC STUDIES: I have personally reviewed the radiological images as listed and agreed with the findings in the report. No results found.   LABORATORY DATA:  I have reviewed the data as listed Lab Results  Component Value Date   WBC 6.4 08/21/2021   HGB 15.3 (H) 08/21/2021   HCT 44.9 08/21/2021   MCV 92.0 08/21/2021   PLT 232 08/21/2021   Recent Labs    03/13/21 0620 03/16/21 0959 07/02/21 0611  NA 137 137  137  K 3.4* 4.5 3.7  CL 105 100 102  CO2 '25 23 25  '$ GLUCOSE 110* 92 90  BUN '21 15 15  '$ CREATININE 0.67 0.68 0.59  CALCIUM 8.9 9.5 8.6*  GFRNONAA >60  --  >60  PROT 6.3*  --   --   ALBUMIN 3.8  --   --   AST 18  --   --   ALT 15  --   --   ALKPHOS 49  --   --   BILITOT 0.8  --   --    Iron/TIBC/Ferritin/ %Sat No results found for: IRON, TIBC, FERRITIN, IRONPCTSAT      ASSESSMENT & PLAN:  1. Erythrocytosis   2. Tobacco use    Erythrocytosis Labs are reviewed and discussed with patient. Polycythemia (erythrocytosis) is an abnormal elevation of hemoglobin (Hgb) and/or hematocrit (Hct) in peripheral blood, and this can be caused by primary etiology, ie bone marrow mutation, or secondary etiology, ie hypoxia, smoking,  etc. most likely secondary to smoking.  Rule out other etiologies. I will obtain erythropoietin, carbo monoxide level, rule out primary etiology, JAK2 with reflex to other mutations,   Tobacco use, smoke cessation was discussed with patient. Recommend patient to start lung cancer screening.  Will refer to lung cancer screening program.  Orders Placed This Encounter  Procedures   CBC with Differential/Platelet    Standing Status:   Future    Number of Occurrences:   1    Standing Expiration Date:   08/21/2022   JAK2 V617F, w Reflex to CALR/E12/MPL    Standing Status:   Future    Number of Occurrences:   1    Standing Expiration Date:   08/21/2022   Carbon monoxide, blood (performed at  ref lab)    Standing Status:   Future    Number of Occurrences:   1    Standing Expiration Date:   08/21/2022   Erythropoietin    Standing Status:   Future    Number of Occurrences:   1    Standing Expiration Date:   08/21/2022    We spent sufficient time to discuss many aspect of care, questions were answered to patient's satisfaction. The patient knows to call the clinic with any problems questions or concerns.  Cc Schermerhorn, Gwen Her,*  Return of visit: 2 weeks Thank you for this kind referral and the opportunity to participate in the care of this patient. A copy of today's note is routed to referring provider    Earlie Server, MD, PhD 08/21/2021

## 2021-08-22 ENCOUNTER — Other Ambulatory Visit: Payer: Self-pay

## 2021-08-22 DIAGNOSIS — Z720 Tobacco use: Secondary | ICD-10-CM

## 2021-08-22 LAB — ERYTHROPOIETIN: Erythropoietin: 8.4 m[IU]/mL (ref 2.6–18.5)

## 2021-08-22 LAB — CARBON MONOXIDE, BLOOD (PERFORMED AT REF LAB): Carbon Monoxide, Blood: 8.6 % — ABNORMAL HIGH (ref 0.0–3.6)

## 2021-09-04 ENCOUNTER — Other Ambulatory Visit: Payer: Self-pay | Admitting: Obstetrics and Gynecology

## 2021-09-04 DIAGNOSIS — Z1231 Encounter for screening mammogram for malignant neoplasm of breast: Secondary | ICD-10-CM

## 2021-09-04 LAB — JAK2 V617F, W REFLEX TO CALR/E12/MPL

## 2021-09-04 LAB — CALR + JAK2 E12-15 + MPL (REFLEXED)

## 2021-09-06 ENCOUNTER — Encounter: Payer: Self-pay | Admitting: Oncology

## 2021-09-06 ENCOUNTER — Inpatient Hospital Stay: Payer: Medicare Other | Attending: Oncology | Admitting: Oncology

## 2021-09-06 ENCOUNTER — Other Ambulatory Visit: Payer: Self-pay

## 2021-09-06 DIAGNOSIS — D751 Secondary polycythemia: Secondary | ICD-10-CM

## 2021-09-06 DIAGNOSIS — Z720 Tobacco use: Secondary | ICD-10-CM | POA: Diagnosis not present

## 2021-09-06 NOTE — Progress Notes (Signed)
HEMATOLOGY-ONCOLOGY TeleHEALTH VISIT PROGRESS NOTE  I connected with Megan Nichols on 09/06/21  at  3:00 PM EDT by video enabled telemedicine visit and verified that I am speaking with the correct person using two identifiers. I discussed the limitations, risks, security and privacy concerns of performing an evaluation and management service by telemedicine and the availability of in-person appointments. The patient expressed understanding and agreed to proceed.   Other persons participating in the visit and their role in the encounter:  None  Patient's location: Home  Provider's location: office Chief Complaint: erythrocytosis   INTERVAL HISTORY Megan Nichols is a 65 y.o. female who has above history reviewed by me today presents for follow up visit for management of erythrocytosis Problems and complaints are listed below:  Patient had blood work up done and presents virtually to review results. No new complaints.   Review of Systems  Constitutional:  Negative for appetite change, chills, fatigue and fever.  HENT:   Negative for hearing loss and voice change.   Eyes:  Negative for eye problems.  Respiratory:  Negative for chest tightness and cough.   Cardiovascular:  Negative for chest pain.  Gastrointestinal:  Negative for abdominal distention, abdominal pain and blood in stool.  Endocrine: Negative for hot flashes.  Genitourinary:  Negative for difficulty urinating and frequency.   Musculoskeletal:  Negative for arthralgias.  Skin:  Negative for itching and rash.  Neurological:  Negative for extremity weakness.  Hematological:  Negative for adenopathy.  Psychiatric/Behavioral:  Negative for confusion.     Past Medical History:  Diagnosis Date   Allergy    Anxiety    Arthritis    Degeneration of intervertebral disc of lumbar region    Depression    Hyperlipidemia    Impaired fasting glucose 05/21/2016   Irritable bowel syndrome with constipation    Obesity,  Class I, BMI 30.0-34.9 (see actual BMI)    Pelvic floor dysfunction    Tobacco use 12/26/2015   Past Surgical History:  Procedure Laterality Date   APPENDECTOMY     BLADDER SURGERY     BREAST CYST ASPIRATION Left    1999   HEMORRHOID SURGERY     RECTOCELE REPAIR N/A 10/09/2018   Procedure: POSTERIOR REPAIR (RECTOCELE);  Surgeon: Schermerhorn, Gwen Her, MD;  Location: [facility] ORS;  Service: Gynecology;  Laterality: N/A;   TONSILLECTOMY      Family History  Problem Relation Age of Onset   Diabetes Father    Heart disease Father    Hypothyroidism Father    Hypertension Father    Leukemia Father    Cancer Father    Intracerebral hemorrhage Mother    AAA (abdominal aortic aneurysm) Mother    Stroke Mother    Hypothyroidism Sister    Hypertension Sister    Stroke Maternal Grandmother    Heart disease Maternal Grandmother    Cancer Maternal Grandfather        ?   Diabetes Paternal Grandmother    Heart attack Paternal Grandfather    Hypothyroidism Sister    Hypertension Sister    Hypothyroidism Sister    Hypertension Sister    Breast cancer Neg Hx     Social History   Socioeconomic History   Marital status: Single    Spouse name: Not on file   Number of children: Not on file   Years of education: Not on file   Highest education level: Not on file  Occupational History   Not on file  Tobacco Use   Smoking status: Every Day    Packs/day: 1.00    Years: 30.00    Pack years: 30.00    Types: Cigarettes   Smokeless tobacco: Never  Vaping Use   Vaping Use: Never used  Substance and Sexual Activity   Alcohol use: Never    Alcohol/week: 0.0 standard drinks   Drug use: No   Sexual activity: Not Currently  Other Topics Concern   Not on file  Social History Narrative   Not on file   Social Determinants of Health   Financial Resource Strain: Not on file  Food Insecurity: Not on file  Transportation Needs: Not on file  Physical Activity: Not on file  Stress: Not on  file  Social Connections: Not on file  Intimate Partner Violence: Not on file    Current Outpatient Medications on File Prior to Visit  Medication Sig Dispense Refill   CALCIUM-VITAMIN D PO Take by mouth. 2000 mg of vitamin D     fluticasone (FLONASE) 50 MCG/ACT nasal spray Place into the nose.     Multiple Vitamins-Minerals (PRESERVISION AREDS 2 PO) Take by mouth.     pravastatin (PRAVACHOL) 80 MG tablet Take 0.5 tablets (40 mg total) by mouth every evening. 30 tablet 3   tiZANidine (ZANAFLEX) 2 MG tablet Take 2 mg by mouth at bedtime.     No current facility-administered medications on file prior to visit.    Allergies  Allergen Reactions   Aspirin     Foaming at the mouth Other reaction(s): Other (See Comments) Foaming at the mouth       Observations/Objective: Today's Vitals   09/06/21 1350  PainSc: 0-No pain   There is no height or weight on file to calculate BMI.  Physical Exam Neurological:     Mental Status: She is alert.    CBC    Component Value Date/Time   WBC 6.4 08/21/2021 1035   RBC 4.88 08/21/2021 1035   HGB 15.3 (H) 08/21/2021 1035   HGB 14.9 07/03/2016 0814   HCT 44.9 08/21/2021 1035   HCT 43.3 07/03/2016 0814   PLT 232 08/21/2021 1035   PLT 299 07/03/2016 0814   MCV 92.0 08/21/2021 1035   MCV 91 07/03/2016 0814   MCH 31.4 08/21/2021 1035   MCHC 34.1 08/21/2021 1035   RDW 12.9 08/21/2021 1035   RDW 14.7 07/03/2016 0814   LYMPHSABS 2.4 08/21/2021 1035   LYMPHSABS 2.0 07/03/2016 0814   MONOABS 0.6 08/21/2021 1035   EOSABS 0.1 08/21/2021 1035   EOSABS 0.1 07/03/2016 0814   BASOSABS 0.0 08/21/2021 1035   BASOSABS 0.0 07/03/2016 0814    CMP     Component Value Date/Time   NA 137 07/02/2021 0611   NA 137 03/16/2021 0959   K 3.7 07/02/2021 0611   CL 102 07/02/2021 0611   CO2 25 07/02/2021 0611   GLUCOSE 90 07/02/2021 0611   BUN 15 07/02/2021 0611   BUN 15 03/16/2021 0959   CREATININE 0.59 07/02/2021 0611   CALCIUM 8.6 (L) 07/02/2021  0611   PROT 6.3 (L) 03/13/2021 0620   PROT 6.3 07/03/2016 0814   ALBUMIN 3.8 03/13/2021 0620   ALBUMIN 4.2 07/03/2016 0814   AST 18 03/13/2021 0620   ALT 15 03/13/2021 0620   ALKPHOS 49 03/13/2021 0620   BILITOT 0.8 03/13/2021 0620   BILITOT 0.3 07/03/2016 0814   GFRNONAA >60 07/02/2021 0611   GFRAA >60 10/05/2018 0902     Assessment and Plan: 1.  Secondary erythrocytosis   2. Tobacco use    # Secondary erythrocytosis Labs are reviewed and discussed with patient. JAK2 V617F mutation negative, with reflex to other mutations CALR, MPL, JAK 2 Ex 12-15 mutations negative. Elevated carbomonoxide level Erythrocytosis is likely due to smoking or other hypoxia conditions Smoking cessation was discussed.   Follow Up Instructions: Follow up in 6 months.     I discussed the assessment and treatment plan with the patient. The patient was provided an opportunity to ask questions and all were answered. The patient agreed with the plan and demonstrated an understanding of the instructions.  The patient was advised to call back or seek an in-person evaluation if the symptoms worsen or if the condition fails to improve as anticipated.   Earlie Server, MD 09/06/2021 7:22 PM

## 2021-09-13 ENCOUNTER — Telehealth: Payer: Self-pay | Admitting: Cardiology

## 2021-09-13 NOTE — Telephone Encounter (Signed)
Called patient and left a VM for her to call back.

## 2021-09-13 NOTE — Telephone Encounter (Signed)
Pt c/o medication issue:  1. Name of Medication: pravastatin   2. How are you currently taking this medication (dosage and times per day)? 40 mg po BID  3. Are you having a reaction (difficulty breathing--STAT)? no  4. What is your medication issue? Patient was told at last visit to take 40 BID but rx says qhs .  Please clarify

## 2021-09-18 NOTE — Telephone Encounter (Signed)
Called patient and clarified that she can take her Repatha either as 40 MG twice a day or 80 MG once a day in the evening. Patient believes she was taking it twice a day just in case she had a reaction to the large dose all at once. She is going to try taking the 80 MG once a day in the evenings and see how she does.

## 2021-09-24 ENCOUNTER — Other Ambulatory Visit: Payer: Self-pay | Admitting: *Deleted

## 2021-09-24 DIAGNOSIS — F17210 Nicotine dependence, cigarettes, uncomplicated: Secondary | ICD-10-CM

## 2021-09-24 DIAGNOSIS — Z87891 Personal history of nicotine dependence: Secondary | ICD-10-CM

## 2021-10-01 ENCOUNTER — Other Ambulatory Visit: Payer: Self-pay

## 2021-10-01 ENCOUNTER — Ambulatory Visit
Admission: RE | Admit: 2021-10-01 | Discharge: 2021-10-01 | Disposition: A | Discharge status: Home / Self Care | Payer: Medicare Other | Source: Ambulatory Visit | Attending: Obstetrics and Gynecology | Admitting: Obstetrics and Gynecology

## 2021-10-01 DIAGNOSIS — Z1231 Encounter for screening mammogram for malignant neoplasm of breast: Secondary | ICD-10-CM | POA: Insufficient documentation

## 2021-10-03 ENCOUNTER — Encounter: Payer: Self-pay | Admitting: Acute Care

## 2021-10-03 ENCOUNTER — Telehealth: Payer: Medicare Other | Admitting: Acute Care

## 2021-10-03 DIAGNOSIS — F17210 Nicotine dependence, cigarettes, uncomplicated: Secondary | ICD-10-CM

## 2021-10-03 NOTE — Progress Notes (Signed)
Virtual Visit via Video Note  I connected with Megan Nichols on 10/03/21 at 11:00 AM EDT by a video enabled telemedicine application and verified that I am speaking with the correct person using two identifiers.  Location: Patient: At home Provider:  [place], [place], [place], [address]    I discussed the limitations of evaluation and management by telemedicine and the availability of in person appointments. The patient expressed understanding and agreed to proceed.   Shared Decision Making Visit Lung Cancer Screening Program [phone])   Eligibility: Age 65 y.o. Pack Years Smoking History Calculation 51 pack year smoking history (# packs/per year x # years smoked) Recent History of coughing up blood  no Unexplained weight loss? no ( >Than 15 pounds within the last 6 months ) Prior History Lung / other cancer no (Diagnosis within the last 5 years already requiring surveillance chest CT Scans). Smoking Status Current Smoker Former Smokers: Years since quit: NA  Quit Date: NA  Visit Components: Discussion included one or more decision making aids. yes Discussion included risk/benefits of screening. yes Discussion included potential follow up diagnostic testing for abnormal scans. yes Discussion included meaning and risk of over diagnosis. yes Discussion included meaning and risk of False Positives. yes Discussion included meaning of total radiation exposure. yes  Counseling Included: Importance of adherence to annual lung cancer LDCT screening. yes Impact of comorbidities on ability to participate in the program. yes Ability and willingness to under diagnostic treatment. yes  Smoking Cessation Counseling: Current Smokers:  Discussed importance of smoking cessation. yes Information about tobacco cessation classes and interventions provided to patient. yes Patient provided with "ticket" for LDCT Scan. yes Symptomatic Patient. no  Counseling Diagnosis Code:  Tobacco Use Z72.0 Asymptomatic Patient yes  Counseling (Intermediate counseling: > three minutes counseling) [id number] Former Smokers:  Discussed the importance of maintaining cigarette abstinence. yes Diagnosis Code: Personal History of Nicotine Dependence. T06.269 Information about tobacco cessation classes and interventions provided to patient. Yes Patient provided with "ticket" for LDCT Scan. yes Written Order for Lung Cancer Screening with LDCT placed in Epic. Yes (CT Chest Lung Cancer Screening Low Dose W/O CM) [id number] Z12.2-Screening of respiratory organs Z87.891-Personal history of nicotine dependence  I have spent 25 minutes of face to face time with Megan Nichols discussing the risks and benefits of lung cancer screening. We viewed a power point together that explained in detail the above noted topics. We paused at intervals to allow for questions to be asked and answered to ensure understanding.We discussed that the single most powerful action that she can take to decrease her risk of developing lung cancer is to quit smoking. We discussed whether or not she is ready to commit to setting a quit date. We discussed options for tools to aid in quitting smoking including nicotine replacement therapy, non-nicotine medications, support groups, Quit Smart classes, and behavior modification. We discussed that often times setting smaller, more achievable goals, such as eliminating 1 cigarette a day for a week and then 2 cigarettes a day for a week can be helpful in slowly decreasing the number of cigarettes smoked. This allows for a sense of accomplishment as well as providing a clinical benefit. I gave her the " Be Stronger Than Your Excuses" card with contact information for community resources, classes, free nicotine replacement therapy, and access to mobile apps, text messaging, and on-line smoking cessation help. I have also given her my card and contact information in the event she needs to  contact me. We discussed the time and location of the scan, and that either Doroteo Glassman RN or I will call with the results within 24-48 hours of receiving them. I have offered her  a copy of the power point we viewed  as a resource in the event they need reinforcement of the concepts we discussed today in the office. The patient verbalized understanding of all of  the above and had no further questions upon leaving the office. They have my contact information in the event they have any further questions.  I spent 3 minutes counseling on smoking cessation and the health risks of continued tobacco abuse.  I explained to the patient that there has been a high incidence of coronary artery disease noted on these exams. I explained that this is a non-gated exam therefore degree or severity cannot be determined. This patient is on statin therapy. I have asked the patient to follow-up with their PCP regarding any incidental finding of coronary artery disease and management with diet or medication as their PCP  feels is clinically indicated. The patient verbalized understanding of the above and had no further questions upon completion of the visit.      Magdalen Spatz, NP 10/03/2021

## 2021-10-03 NOTE — Patient Instructions (Signed)
Thank you for participating in the [facility]. It was our pleasure to meet you today. We will call you with the results of your scan within the next few days. Your scan will be assigned a Lung RADS category score by the physicians reading the scans.  This Lung RADS score determines follow up scanning.  See below for description of categories, and follow up screening recommendations. We will be in touch to schedule your follow up screening annually or based on recommendations of our providers. We will fax a copy of your scan results to your Primary Care Physician, or the physician who referred you to the program, to ensure they have the results. Please call the office if you have any questions or concerns regarding your scanning experience or results.  Our office number is [phone]. Please speak with Denise Phelps, RN. She is our Lung Cancer Screening RN. If she is unavailable when you call, please have the office staff send her a message. She will return your call at her earliest convenience. Remember, if your scan is normal, we will scan you annually as long as you continue to meet the criteria for the program. (Age 55-77, Current smoker or smoker who has quit within the last 15 years). If you are a smoker, remember, quitting is the single most powerful action that you can take to decrease your risk of lung cancer and other pulmonary, breathing related problems. We know quitting is hard, and we are here to help.  Please let us know if there is anything we can do to help you meet your goal of quitting. If you are a former smoker, congratulations. We are proud of you! Remain smoke free! Remember you can refer friends or family members through the number above.  We will screen them to make sure they meet criteria for the program. Thank you for helping us take better care of you by participating in Lung Screening.  Lung RADS Categories:  Lung RADS 1: no nodules  or definitely non-concerning nodules.  Recommendation is for a repeat annual scan in 12 months.  Lung RADS 2:  nodules that are non-concerning in appearance and behavior with a very low likelihood of becoming an active cancer. Recommendation is for a repeat annual scan in 12 months.  Lung RADS 3: nodules that are probably non-concerning , includes nodules with a low likelihood of becoming an active cancer.  Recommendation is for a 6-month repeat screening scan. Often noted after an upper respiratory illness. We will be in touch to make sure you have no questions, and to schedule your 6-month scan.  Lung RADS 4 A: nodules with concerning findings, recommendation is most often for a follow up scan in 3 months or additional testing based on our provider's assessment of the scan. We will be in touch to make sure you have no questions and to schedule the recommended 3 month follow up scan.  Lung RADS 4 B:  indicates findings that are concerning. We will be in touch with you to schedule additional diagnostic testing based on our provider's  assessment of the scan.   

## 2021-10-08 ENCOUNTER — Ambulatory Visit
Admission: RE | Admit: 2021-10-08 | Discharge: 2021-10-08 | Disposition: A | Discharge status: Home / Self Care | Payer: Medicare Other | Source: Ambulatory Visit | Attending: Acute Care | Admitting: Acute Care

## 2021-10-08 ENCOUNTER — Other Ambulatory Visit: Payer: Self-pay

## 2021-10-08 DIAGNOSIS — F17210 Nicotine dependence, cigarettes, uncomplicated: Secondary | ICD-10-CM | POA: Diagnosis present

## 2021-10-08 DIAGNOSIS — Z87891 Personal history of nicotine dependence: Secondary | ICD-10-CM | POA: Diagnosis present

## 2021-10-11 ENCOUNTER — Other Ambulatory Visit
Admission: RE | Admit: 2021-10-11 | Discharge: 2021-10-11 | Disposition: A | Discharge status: Home / Self Care | Payer: Medicare Other | Attending: Cardiology | Admitting: Cardiology

## 2021-10-11 DIAGNOSIS — E7800 Pure hypercholesterolemia, unspecified: Secondary | ICD-10-CM | POA: Diagnosis present

## 2021-10-11 LAB — LIPID PANEL
Cholesterol: 224 mg/dL — ABNORMAL HIGH (ref 0–200)
HDL: 82 mg/dL (ref >40)
LDL Cholesterol: 112 mg/dL — ABNORMAL HIGH (ref 0–99)
Total CHOL/HDL Ratio: 2.7 RATIO
Triglycerides: 151 mg/dL — ABNORMAL HIGH (ref <150)
VLDL: 30 mg/dL (ref 0–40)

## 2021-10-12 ENCOUNTER — Telehealth: Payer: Self-pay

## 2021-10-12 MED ORDER — REPATHA 140 MG/ML ~~LOC~~ SOSY: 1.0000 "pen " | PREFILLED_SYRINGE | SUBCUTANEOUS | 11 refills | Status: DC

## 2021-10-12 NOTE — Telephone Encounter (Signed)
Called patient and informed her of the recommendations as documented below. Patient was agreeable to plan. Prescription has been sent into patients pharmacy, she will let me know if insurance does not cover this for her.

## 2021-10-12 NOTE — Telephone Encounter (Signed)
-----   Message from Kate Sable, MD sent at 10/12/2021 12:53 PM EDT ----- Cholesterol still elevated.  Start PCSK9, Repatha or Praluent if insurance approves.  Continue Pravachol 80 mg daily as prescribed.

## 2021-10-15 ENCOUNTER — Telehealth: Payer: Self-pay

## 2021-10-15 NOTE — Telephone Encounter (Signed)
Prior Authorization completed in [email or web address] for Repatha 140mg /mL  KEY: [id number]  RESPONSE: Your information has been submitted to [place]. [employer] will review the request and notify you of the determination decision directly, typically within 3 business days of your submission and once all necessary information is received.

## 2021-10-18 ENCOUNTER — Encounter: Payer: Self-pay | Admitting: *Deleted

## 2021-10-18 ENCOUNTER — Other Ambulatory Visit: Payer: Self-pay | Admitting: Acute Care

## 2021-10-18 DIAGNOSIS — Z87891 Personal history of nicotine dependence: Secondary | ICD-10-CM

## 2021-10-18 DIAGNOSIS — F17210 Nicotine dependence, cigarettes, uncomplicated: Secondary | ICD-10-CM

## 2021-11-19 ENCOUNTER — Telehealth: Payer: Self-pay | Admitting: Cardiology

## 2021-11-19 DIAGNOSIS — E785 Hyperlipidemia, unspecified: Secondary | ICD-10-CM

## 2021-11-19 NOTE — Telephone Encounter (Signed)
To MD to review/ provide recommendations.

## 2021-11-19 NOTE — Telephone Encounter (Signed)
Patient has fu in December and wants to know if fu labs needed prior to see if meds are working

## 2021-11-20 NOTE — Telephone Encounter (Signed)
Megan Sable, MD  [facility] Triage 15 hours ago (5:03 PM)   Yes, obtain fasting lipid profile about 1 week prior to follow-up visit.

## 2021-11-20 NOTE — Telephone Encounter (Signed)
Patient made aware of Dr. Thereasa Solo response and recommendation. Lab appt for fasting lipid scheduled on 11/27/21 @ 8am.  Patient verbalized understanding and voiced appreciation for the call.

## 2021-11-27 ENCOUNTER — Other Ambulatory Visit: Payer: Self-pay

## 2021-11-27 ENCOUNTER — Other Ambulatory Visit (INDEPENDENT_AMBULATORY_CARE_PROVIDER_SITE_OTHER): Payer: Medicare Other

## 2021-11-27 DIAGNOSIS — E785 Hyperlipidemia, unspecified: Secondary | ICD-10-CM | POA: Diagnosis not present

## 2021-11-28 LAB — LIPID PANEL
Chol/HDL Ratio: 1.7 ratio (ref 0.0–4.4)
Cholesterol, Total: 141 mg/dL (ref 100–199)
HDL: 83 mg/dL (ref >39)
LDL Chol Calc (NIH): 31 mg/dL (ref 0–99)
Triglycerides: 174 mg/dL — ABNORMAL HIGH (ref 0–149)
VLDL Cholesterol Cal: 27 mg/dL (ref 5–40)

## 2021-12-03 ENCOUNTER — Ambulatory Visit: Payer: Medicare Other | Admitting: Cardiology

## 2021-12-03 ENCOUNTER — Other Ambulatory Visit: Payer: Self-pay

## 2021-12-03 ENCOUNTER — Encounter: Payer: Self-pay | Admitting: Cardiology

## 2021-12-03 VITALS — BP 122/74 | HR 75 | Ht 63.0 in | Wt 191.4 lb

## 2021-12-03 DIAGNOSIS — IMO0001 Reserved for inherently not codable concepts without codable children: Secondary | ICD-10-CM

## 2021-12-03 DIAGNOSIS — F17200 Nicotine dependence, unspecified, uncomplicated: Secondary | ICD-10-CM | POA: Diagnosis not present

## 2021-12-03 DIAGNOSIS — I2510 Atherosclerotic heart disease of native coronary artery without angina pectoris: Secondary | ICD-10-CM

## 2021-12-03 DIAGNOSIS — E782 Mixed hyperlipidemia: Secondary | ICD-10-CM

## 2021-12-03 MED ORDER — PRAVASTATIN SODIUM 80 MG PO TABS: 40.0000 mg | ORAL_TABLET | Freq: Every evening | ORAL | 3 refills | Status: DC

## 2021-12-03 NOTE — Progress Notes (Signed)
Cardiology Office Note:    Date:  12/03/2021   ID:  Megan Nichols, DOB Dec 14, 1956, MRN [id number]  PCP:  Derinda Late, [place]  Cardiologist:  Kate Sable, MD  Advanced Practice Provider:  No care team member to display Electrophysiologist:  None       Referring MD: Derinda Late, MD   No chief complaint on file. Follow up  History of Present Illness:    Megan Nichols is a 65 y.o. female with a hx of anxiety, mild nonobstructive CAD, hyperlipidemia,, current smoker x30 years, who presents for follow-up.    Being seen for CAD and hyperlipidemia.  Started on Repatha 6 weeks ago after repeat cholesterol levels were not controlled.  Tolerating Pravachol.  Has taken 3 doses of Repatha.  Repeat fasting blood work 6 days ago showed elevated triglycerides.  She still smokes, otherwise has no other concerns at this time.     Prior notes Echocardiogram 02/02/5572, normal systolic and diastolic function, EF 55 to 60% Coronary CTA 04/05/2021, calcium score 192, mild nonobstructive CAD proximal LAD stenosis. Previous statin intolerance, currently tolerating Pravachol  Past Medical History:  Diagnosis Date   Allergy    Anxiety    Arthritis    Degeneration of intervertebral disc of lumbar region    Depression    Hyperlipidemia    Impaired fasting glucose 05/21/2016   Irritable bowel syndrome with constipation    Obesity, Class I, BMI 30.0-34.9 (see actual BMI)    Pelvic floor dysfunction    Tobacco use 12/26/2015    Past Surgical History:  Procedure Laterality Date   APPENDECTOMY     BLADDER SURGERY     BREAST CYST ASPIRATION Left    1999   HEMORRHOID SURGERY     RECTOCELE REPAIR N/A 10/09/2018   Procedure: POSTERIOR REPAIR (RECTOCELE);  Surgeon: Schermerhorn, Gwen Her, MD;  Location: [facility] ORS;  Service: Gynecology;  Laterality: N/A;   TONSILLECTOMY      Current Medications: Current Meds  Medication Sig   CALCIUM-VITAMIN  D PO Take by mouth. 2000 mg of vitamin D   Evolocumab (REPATHA) 140 MG/ML SOSY Inject 1 pen into the skin every 14 (fourteen) days.   fluticasone (FLONASE) 50 MCG/ACT nasal spray Place into the nose.   Multiple Vitamins-Minerals (PRESERVISION AREDS 2 PO) Take by mouth.   tiZANidine (ZANAFLEX) 2 MG tablet Take 2 mg by mouth at bedtime.     Allergies:   Aspirin   Social History   Socioeconomic History   Marital status: Single    Spouse name: Not on file   Number of children: Not on file   Years of education: Not on file   Highest education level: Not on file  Occupational History   Not on file  Tobacco Use   Smoking status: Every Day    Packs/day: 1.00    Years: 30.00    Pack years: 30.00    Types: Cigarettes   Smokeless tobacco: Never  Vaping Use   Vaping Use: Never used  Substance and Sexual Activity   Alcohol use: Never    Alcohol/week: 0.0 standard drinks   Drug use: No   Sexual activity: Not Currently  Other Topics Concern   Not on file  Social History Narrative   Not on file   Social Determinants of Health   Financial Resource Strain: Not on file  Food Insecurity: Not on file  Transportation Needs: Not on file  Physical Activity: Not on  file  Stress: Not on file  Social Connections: Not on file     Family History: The patient's family history includes AAA (abdominal aortic aneurysm) in her mother; Cancer in her father and maternal grandfather; Diabetes in her father and paternal grandmother; Heart attack in her paternal grandfather; Heart disease in her father and maternal grandmother; Hypertension in her father, sister, sister, and sister; Hypothyroidism in her father, sister, sister, and sister; Intracerebral hemorrhage in her mother; Leukemia in her father; Stroke in her maternal grandmother and mother. There is no history of Breast cancer.  ROS:   Please see the history of present illness.     All other systems reviewed and are negative.  EKGs/Labs/Other  Studies Reviewed:    The following studies were reviewed today:   EKG:  EKG is ordered today.  EKG shows normal sinus rhythm, cannot rule out anterior infarct.  Recent Labs: 03/13/2021: ALT 15 07/02/2021: BUN 15; Creatinine, Ser 0.59; Potassium 3.7; Sodium 137 08/21/2021: Hemoglobin 15.3; Platelets 232  Recent Lipid Panel    Component Value Date/Time   CHOL 141 11/27/2021 0811   TRIG 174 (H) 11/27/2021 0811   HDL 83 11/27/2021 0811   CHOLHDL 1.7 11/27/2021 0811   CHOLHDL 2.7 10/11/2021 0916   VLDL 30 10/11/2021 0916   LDLCALC 31 11/27/2021 0811     Risk Assessment/Calculations:      Physical Exam:    VS:  BP 122/74   Pulse 75   Ht 5\' 3"  (1.6 m)   Wt 191 lb 6.4 oz (86.8 kg)   BMI 33.90 kg/m     Wt Readings from Last 3 Encounters:  12/03/21 191 lb 6.4 oz (86.8 kg)  10/08/21 185 lb (83.9 kg)  08/21/21 189 lb 1.6 oz (85.8 kg)     GEN:  Well nourished, well developed in no acute distress HEENT: Normal NECK: No JVD; No carotid bruits CARDIAC: RRR, no murmurs, rubs, gallops RESPIRATORY:  Clear to auscultation without rales, wheezing or rhonchi  ABDOMEN: Soft, non-tender, non-distended MUSCULOSKELETAL:  No edema; No deformity  SKIN: Warm and dry NEUROLOGIC:  Alert and oriented x 3 PSYCHIATRIC:  Normal affect   ASSESSMENT:    1. Coronary artery disease involving native coronary artery of native heart without angina pectoris   2. Mixed hyperlipidemia   3. Smoking      PLAN:    In order of problems listed above:  CAD, mild nonobstructive disease in the proximal LAD, coronary calcium score 192.  Echo 03/2021 showed normal systolic and diastolic function.denies chest pain..  Not tolerant to aspirin, Plavix causing bruising.  Continue Pravachol, Repatha. Hyperlipidemia, LDL at goal.  Triglycerides still elevated.  Continue Pravachol, Repatha.  Repeat fasting lipid profile in 3 months.  If triglycerides still elevated, plan to stop Pravachol and start  fenofibrate. Current smoker, cessation advised.  Follow-up in 5 months.     Medication Adjustments/Labs and Tests Ordered: Current medicines are reviewed at length with the patient today.  Concerns regarding medicines are outlined above.  Orders Placed This Encounter  Procedures   Lipid panel   EKG 12-Lead     Meds ordered this encounter  Medications   pravastatin (PRAVACHOL) 80 MG tablet    Sig: Take 0.5 tablets (40 mg total) by mouth every evening.    Dispense:  30 tablet    Refill:  3      Patient Instructions  Medication Instructions:  Your physician recommends that you continue on your current medications as directed. Please refer  to the Current Medication list given to you today.  *If you need a refill on your cardiac medications before your next appointment, please call your pharmacy*   Lab Work:  Your physician recommends that you return for a FASTING lipid profile: IN 3 Months  - You will need to be fasting. Please do not have anything to eat or drink after midnight the morning you have the lab work. You may only have water or black coffee with no cream or sugar.   We will call you closer to the 3 month date to schedule you to come into to the office for the lab draw.   Testing/Procedures: Non ordered   Follow-Up: At [facility], you and your health needs are our priority.  As part of our continuing mission to provide you with exceptional heart care, we have created designated Provider Care Teams.  These Care Teams include your primary Cardiologist (physician) and Advanced Practice Providers (APPs -  Physician Assistants and Nurse Practitioners) who all work together to provide you with the care you need, when you need it.  We recommend signing up for the patient portal called "MyChart".  Sign up information is provided on this After Visit Summary.  MyChart is used to connect with patients for Virtual Visits (Telemedicine).  Patients are able to view  lab/test results, encounter notes, upcoming appointments, etc.  Non-urgent messages can be sent to your provider as well.   To learn more about what you can do with MyChart, go to [email or web address].    Your next appointment:   4-5 months   The format for your next appointment:   In Person  Provider:   You may see Kate Sable, MD or one of the following Advanced Practice Providers on your designated Care Team:   Murray Hodgkins, NP Christell Faith, PA-C Cadence Kathlen Mody, [place]    Other Instructions    Signed, Kate Sable, MD  12/03/2021 12:40 PM    [place]

## 2021-12-03 NOTE — Patient Instructions (Addendum)
Medication Instructions:  Your physician recommends that you continue on your current medications as directed. Please refer to the Current Medication list given to you today.  *If you need a refill on your cardiac medications before your next appointment, please call your pharmacy*   Lab Work:  Your physician recommends that you return for a FASTING lipid profile: IN 3 Months  - You will need to be fasting. Please do not have anything to eat or drink after midnight the morning you have the lab work. You may only have water or black coffee with no cream or sugar.   We will call you closer to the 3 month date to schedule you to come into to the office for the lab draw.   Testing/Procedures: Non ordered   Follow-Up: At [facility], you and your health needs are our priority.  As part of our continuing mission to provide you with exceptional heart care, we have created designated Provider Care Teams.  These Care Teams include your primary Cardiologist (physician) and Advanced Practice Providers (APPs -  Physician Assistants and Nurse Practitioners) who all work together to provide you with the care you need, when you need it.  We recommend signing up for the patient portal called "MyChart".  Sign up information is provided on this After Visit Summary.  MyChart is used to connect with patients for Virtual Visits (Telemedicine).  Patients are able to view lab/test results, encounter notes, upcoming appointments, etc.  Non-urgent messages can be sent to your provider as well.   To learn more about what you can do with MyChart, go to [email or web address].    Your next appointment:   4-5 months   The format for your next appointment:   In Person  Provider:   You may see Kate Sable, MD or one of the following Advanced Practice Providers on your designated Care Team:   Murray Hodgkins, NP Christell Faith, PA-C Cadence Kathlen Mody, [place]    Other Instructions

## 2022-03-05 ENCOUNTER — Other Ambulatory Visit: Payer: Self-pay

## 2022-03-05 ENCOUNTER — Inpatient Hospital Stay: Payer: Medicare Other | Attending: Oncology

## 2022-03-05 DIAGNOSIS — D751 Secondary polycythemia: Secondary | ICD-10-CM | POA: Insufficient documentation

## 2022-03-05 LAB — CBC WITH DIFFERENTIAL/PLATELET
Abs Immature Granulocytes: 0.08 10*3/uL — ABNORMAL HIGH (ref 0.00–0.07)
Basophils Absolute: 0.1 10*3/uL (ref 0.0–0.1)
Basophils Relative: 1 %
Eosinophils Absolute: 0.1 10*3/uL (ref 0.0–0.5)
Eosinophils Relative: 2 %
HCT: 44.8 % (ref 36.0–46.0)
Hemoglobin: 15.1 g/dL — ABNORMAL HIGH (ref 12.0–15.0)
Immature Granulocytes: 1 %
Lymphocytes Relative: 32 %
Lymphs Abs: 1.9 10*3/uL (ref 0.7–4.0)
MCH: 31.2 pg (ref 26.0–34.0)
MCHC: 33.7 g/dL (ref 30.0–36.0)
MCV: 92.6 fL (ref 80.0–100.0)
Monocytes Absolute: 0.7 10*3/uL (ref 0.1–1.0)
Monocytes Relative: 12 %
Neutro Abs: 3.1 10*3/uL (ref 1.7–7.7)
Neutrophils Relative %: 52 %
Platelets: 250 10*3/uL (ref 150–400)
RBC: 4.84 MIL/uL (ref 3.87–5.11)
RDW: 12.6 % (ref 11.5–15.5)
WBC: 6 10*3/uL (ref 4.0–10.5)
nRBC: 0 % (ref 0.0–0.2)

## 2022-03-07 ENCOUNTER — Other Ambulatory Visit: Payer: Self-pay

## 2022-03-07 ENCOUNTER — Encounter: Payer: Self-pay | Admitting: Oncology

## 2022-03-07 ENCOUNTER — Inpatient Hospital Stay: Payer: Medicare Other | Admitting: Oncology

## 2022-03-07 DIAGNOSIS — D751 Secondary polycythemia: Secondary | ICD-10-CM | POA: Diagnosis not present

## 2022-03-07 DIAGNOSIS — Z720 Tobacco use: Secondary | ICD-10-CM | POA: Diagnosis not present

## 2022-03-07 NOTE — Progress Notes (Signed)
HEMATOLOGY-ONCOLOGY TeleHEALTH VISIT PROGRESS NOTE  ?I connected with Megan Nichols on 03/07/22  at  2:30 PM EST by video enabled telemedicine visit and verified that I am speaking with the correct person using two identifiers. ?I discussed the limitations, risks, security and privacy concerns of performing an evaluation and management service by telemedicine and the availability of in-person appointments. The patient expressed understanding and agreed to proceed.  ? ?Other persons participating in the visit and their role in the encounter:  ?None ? ?Patient's location: Home  ?Provider's location: office ?Chief Complaint: erythrocytosis ? ? ?INTERVAL HISTORY ?Megan Nichols is a 66 y.o. female who has above history reviewed by me today presents for follow up visit for management of erythrocytosis ?Patient presents to discuss her results. No new complaints.  ? ?Review of Systems  ?Constitutional:  Negative for appetite change, chills, fatigue and fever.  ?HENT:   Negative for hearing loss and voice change.   ?Eyes:  Negative for eye problems.  ?Respiratory:  Negative for chest tightness and cough.   ?Cardiovascular:  Negative for chest pain.  ?Gastrointestinal:  Negative for abdominal distention, abdominal pain and blood in stool.  ?Endocrine: Negative for hot flashes.  ?Genitourinary:  Negative for difficulty urinating and frequency.   ?Musculoskeletal:  Negative for arthralgias.  ?Skin:  Negative for itching and rash.  ?Neurological:  Negative for extremity weakness.  ?Hematological:  Negative for adenopathy.  ?Psychiatric/Behavioral:  Negative for confusion.    ? ?Past Medical History:  ?Diagnosis Date  ? Allergy   ? Anxiety   ? Arthritis   ? Degeneration of intervertebral disc of lumbar region   ? Depression   ? Hyperlipidemia   ? Impaired fasting glucose 05/21/2016  ? Irritable bowel syndrome with constipation   ? Obesity, Class I, BMI 30.0-34.9 (see actual BMI)   ? Pelvic floor dysfunction   ? Tobacco  use 12/26/2015  ? ?Past Surgical History:  ?Procedure Laterality Date  ? APPENDECTOMY    ? BLADDER SURGERY    ? BREAST CYST ASPIRATION Left   ? 1999  ? HEMORRHOID SURGERY    ? RECTOCELE REPAIR N/A 10/09/2018  ? Procedure: POSTERIOR REPAIR (RECTOCELE);  Surgeon: Schermerhorn, Gwen Her, MD;  Location: [facility] ORS;  Service: Gynecology;  Laterality: N/A;  ? TONSILLECTOMY    ?  ?Family History  ?Problem Relation Age of Onset  ? Diabetes Father   ? Heart disease Father   ? Hypothyroidism Father   ? Hypertension Father   ? Leukemia Father   ? Cancer Father   ? Intracerebral hemorrhage Mother   ? AAA (abdominal aortic aneurysm) Mother   ? Stroke Mother   ? Hypothyroidism Sister   ? Hypertension Sister   ? Stroke Maternal Grandmother   ? Heart disease Maternal Grandmother   ? Cancer Maternal Grandfather   ?     ?  ? Diabetes Paternal Grandmother   ? Heart attack Paternal Grandfather   ? Hypothyroidism Sister   ? Hypertension Sister   ? Hypothyroidism Sister   ? Hypertension Sister   ? Breast cancer Neg Hx   ?  ?Social History  ? ?Socioeconomic History  ? Marital status: Single  ?  Spouse name: Not on file  ? Number of children: Not on file  ? Years of education: Not on file  ? Highest education level: Not on file  ?Occupational History  ? Not on file  ?Tobacco Use  ? Smoking status: Every Day  ?  Packs/day: 1.00  ?  Years: 30.00  ?  Pack years: 30.00  ?  Types: Cigarettes  ? Smokeless tobacco: Never  ?Vaping Use  ? Vaping Use: Never used  ?Substance and Sexual Activity  ? Alcohol use: Never  ?  Alcohol/week: 0.0 standard drinks  ? Drug use: No  ? Sexual activity: Not Currently  ?Other Topics Concern  ? Not on file  ?Social History Narrative  ? Not on file  ? ?Social Determinants of Health  ? ?Financial Resource Strain: Not on file  ?Food Insecurity: Not on file  ?Transportation Needs: Not on file  ?Physical Activity: Not on file  ?Stress: Not on file  ?Social Connections: Not on file  ?Intimate Partner Violence: Not on file  ?   ?Current Outpatient Medications on File Prior to Visit  ?Medication Sig Dispense Refill  ? CALCIUM-VITAMIN D PO Take by mouth. 2000 mg of vitamin D    ? Evolocumab (REPATHA) 140 MG/ML SOSY Inject 1 pen into the skin every 14 (fourteen) days. 2.1 mL 11  ? Multiple Vitamins-Minerals (PRESERVISION AREDS 2 PO) Take by mouth.    ? REPATHA SURECLICK 010 MG/ML SOAJ SMARTSIG:1 pre-filled pen syringe SUB-Q Every 2 Weeks    ? tiZANidine (ZANAFLEX) 2 MG tablet Take 2 mg by mouth at bedtime.    ? fluticasone (FLONASE) 50 MCG/ACT nasal spray Place into the nose.    ? pravastatin (PRAVACHOL) 80 MG tablet Take 0.5 tablets (40 mg total) by mouth every evening. 30 tablet 3  ? ?No current facility-administered medications on file prior to visit.  ?  ?Allergies  ?Allergen Reactions  ? Aspirin   ?  Foaming at the mouth ?Other reaction(s): Other (See Comments) ?Foaming at the mouth  ?  ? ?  ?Observations/Objective: ?Today's Vitals  ? 03/07/22 1314  ?PainSc: 0-No pain  ? ?There is no height or weight on file to calculate BMI.  ?Physical Exam ?Neurological:  ?   Mental Status: She is alert.  ? ? ?CBC ?   ?Component Value Date/Time  ? WBC 6.0 03/05/2022 0750  ? RBC 4.84 03/05/2022 0750  ? HGB 15.1 (H) 03/05/2022 0750  ? HGB 14.9 07/03/2016 0814  ? HCT 44.8 03/05/2022 0750  ? HCT 43.3 07/03/2016 0814  ? PLT 250 03/05/2022 0750  ? PLT 299 07/03/2016 0814  ? MCV 92.6 03/05/2022 0750  ? MCV 91 07/03/2016 0814  ? MCH 31.2 03/05/2022 0750  ? MCHC 33.7 03/05/2022 0750  ? RDW 12.6 03/05/2022 0750  ? RDW 14.7 07/03/2016 0814  ? LYMPHSABS 1.9 03/05/2022 0750  ? LYMPHSABS 2.0 07/03/2016 0814  ? MONOABS 0.7 03/05/2022 0750  ? EOSABS 0.1 03/05/2022 0750  ? EOSABS 0.1 07/03/2016 0814  ? BASOSABS 0.1 03/05/2022 0750  ? BASOSABS 0.0 07/03/2016 0814  ?  ?CMP  ?   ?Component Value Date/Time  ? NA 137 07/02/2021 0611  ? NA 137 03/16/2021 0959  ? K 3.7 07/02/2021 0611  ? CL 102 07/02/2021 0611  ? CO2 25 07/02/2021 0611  ? GLUCOSE 90 07/02/2021 0611  ? BUN 15  07/02/2021 0611  ? BUN 15 03/16/2021 0959  ? CREATININE 0.59 07/02/2021 0611  ? CALCIUM 8.6 (L) 07/02/2021 0712  ? PROT 6.3 (L) 03/13/2021 1975  ? PROT 6.3 07/03/2016 0814  ? ALBUMIN 3.8 03/13/2021 0620  ? ALBUMIN 4.2 07/03/2016 0814  ? AST 18 03/13/2021 0620  ? ALT 15 03/13/2021 0620  ? ALKPHOS 49 03/13/2021 0620  ? BILITOT 0.8 03/13/2021 0620  ? BILITOT 0.3 07/03/2016  9323  ? GFRNONAA >60 07/02/2021 5573  ? GFRAA >60 10/05/2018 0902  ?  ? ?Assessment and Plan: ?1. Erythrocytosis   ?2. Tobacco use   ? ?# Secondary erythrocytosis ?Labs are reviewed and discussed with patient. ?JAK2 V617F mutation negative, with reflex to other mutations CALR, MPL, JAK 2 Ex 12-15 mutations negative. ?Elevated carbomonoxide level ?Erythrocytosis is likely due to smoking or other hypoxia conditions ?Hct is < 50, no need for phlebotomy.  ?Smoking cessation was discussed.  ? ?Follow Up Instructions: ?Follow up in 12 months or earlier if needed.  ? ? ? ?I discussed the assessment and treatment plan with the patient. The patient was provided an opportunity to ask questions and all were answered. The patient agreed with the plan and demonstrated an understanding of the instructions.  ?The patient was advised to call back or seek an in-person evaluation if the symptoms worsen or if the condition fails to improve as anticipated.  ? ?Earlie Server, MD 03/07/2022 10:18 PM  ? ?

## 2022-03-07 NOTE — Progress Notes (Signed)
Patient being seen for follow up via video visit. Patient denies any concerns.  ?

## 2022-04-18 ENCOUNTER — Encounter: Payer: Self-pay | Admitting: Cardiology

## 2022-04-18 ENCOUNTER — Ambulatory Visit: Payer: Medicare Other | Admitting: Cardiology

## 2022-04-18 ENCOUNTER — Other Ambulatory Visit
Admission: RE | Admit: 2022-04-18 | Discharge: 2022-04-18 | Disposition: A | Discharge status: Home / Self Care | Payer: Medicare Other | Attending: Cardiology | Admitting: Cardiology

## 2022-04-18 VITALS — BP 110/62 | HR 68 | Ht 63.0 in | Wt 192.0 lb

## 2022-04-18 DIAGNOSIS — I2510 Atherosclerotic heart disease of native coronary artery without angina pectoris: Secondary | ICD-10-CM | POA: Diagnosis not present

## 2022-04-18 DIAGNOSIS — E782 Mixed hyperlipidemia: Secondary | ICD-10-CM

## 2022-04-18 LAB — LIPID PANEL
Cholesterol: 118 mg/dL (ref 0–200)
HDL: 72 mg/dL (ref >40)
LDL Cholesterol: 33 mg/dL (ref 0–99)
Total CHOL/HDL Ratio: 1.6 RATIO
Triglycerides: 66 mg/dL (ref <150)
VLDL: 13 mg/dL (ref 0–40)

## 2022-04-18 NOTE — Patient Instructions (Signed)
Medication Instructions:  ? ?Your physician recommends that you continue on your current medications as directed. Please refer to the Current Medication list given to you today.  ? ?*If you need a refill on your cardiac medications before your next appointment, please call your pharmacy* ? ? ?Lab Work: ? ?Get a fasting Lipid drawn today at the medical mall. ? ? ?Testing/Procedures: ? ?None ordered ? ? ?Follow-Up: ?At [facility], you and your health needs are our priority.  As part of our continuing mission to provide you with exceptional heart care, we have created designated Provider Care Teams.  These Care Teams include your primary Cardiologist (physician) and Advanced Practice Providers (APPs -  Physician Assistants and Nurse Practitioners) who all work together to provide you with the care you need, when you need it. ? ?We recommend signing up for the patient portal called "MyChart".  Sign up information is provided on this After Visit Summary.  MyChart is used to connect with patients for Virtual Visits (Telemedicine).  Patients are able to view lab/test results, encounter notes, upcoming appointments, etc.  Non-urgent messages can be sent to your provider as well.   ?To learn more about what you can do with MyChart, go to [email or web address].   ? ?Your next appointment:   ?1 year(s) ? ?The format for your next appointment:   ?In Person ? ?Provider:   ?You may see Kate Sable, MD or one of the following Advanced Practice Providers on your designated Care Team:   ?Murray Hodgkins, NP ?Christell Faith, PA-C ?Cadence Kathlen Mody, PA-C  ? ? ?Other Instructions ? ? ?Important Information About Sugar ? ? ? ? ?  ?

## 2022-04-18 NOTE — Progress Notes (Addendum)
?Cardiology Office Note:   ? ?Date:  04/18/2022  ? ?ID:  Megan Nichols, DOB 25-Nov-1956, MRN [id number] ? ?PCP:  Derinda Late, MD ?  ?[place]  ?Cardiologist:  Kate Sable, MD  ?Advanced Practice Provider:  No care team member to display ?Electrophysiologist:  None  ?    ? ?Referring MD: Derinda Late, MD  ? ?Chief Complaint  ?Patient presents with  ? Other  ?  5 month follow up -- Meds reviewed verbally with patient.   ? ? ?History of Present Illness:   ? ?Megan Nichols is a 66 y.o. female with a hx of anxiety, mild nonobstructive CAD, hyperlipidemia, smoker x30 years, who presents for follow-up.   ? ?Being seen for CAD and hyperlipidemia.  Started on Repatha with improvement in cholesterol levels.  Triglycerides still elevated on last visit.  Plan was to repeat lipid panel, patient has not done this yet.  Tolerating current doses of Pravachol.   ? ?Prior notes ?Echocardiogram 07/31/9561, normal systolic and diastolic function, EF 55 to 60% ?Coronary CTA 04/05/2021, calcium score 192, mild nonobstructive CAD proximal LAD stenosis. ?Previous statin intolerance, currently tolerating Pravachol ? ?Past Medical History:  ?Diagnosis Date  ? Allergy   ? Anxiety   ? Arthritis   ? Degeneration of intervertebral disc of lumbar region   ? Depression   ? Hyperlipidemia   ? Impaired fasting glucose 05/21/2016  ? Irritable bowel syndrome with constipation   ? Obesity, Class I, BMI 30.0-34.9 (see actual BMI)   ? Pelvic floor dysfunction   ? Tobacco use 12/26/2015  ? ? ?Past Surgical History:  ?Procedure Laterality Date  ? APPENDECTOMY    ? BLADDER SURGERY    ? BREAST CYST ASPIRATION Left   ? 1999  ? HEMORRHOID SURGERY    ? RECTOCELE REPAIR N/A 10/09/2018  ? Procedure: POSTERIOR REPAIR (RECTOCELE);  Surgeon: Schermerhorn, Gwen Her, MD;  Location: [facility] ORS;  Service: Gynecology;  Laterality: N/A;  ? TONSILLECTOMY    ? ? ?Current Medications: ?Current Meds  ?Medication Sig  ? CALCIUM-VITAMIN  D PO Take by mouth. 2000 mg of vitamin D  ? Evolocumab (REPATHA) 140 MG/ML SOSY Inject 1 pen into the skin every 14 (fourteen) days.  ? Multiple Vitamins-Minerals (PRESERVISION AREDS 2 PO) Take by mouth.  ? pravastatin (PRAVACHOL) 80 MG tablet Take 80 mg by mouth at bedtime.  ? tiZANidine (ZANAFLEX) 2 MG tablet Take 2 mg by mouth at bedtime.  ?  ? ?Allergies:   Aspirin  ? ?Social History  ? ?Socioeconomic History  ? Marital status: Single  ?  Spouse name: Not on file  ? Number of children: Not on file  ? Years of education: Not on file  ? Highest education level: Not on file  ?Occupational History  ? Not on file  ?Tobacco Use  ? Smoking status: Every Day  ?  Packs/day: 1.00  ?  Years: 30.00  ?  Pack years: 30.00  ?  Types: Cigarettes  ? Smokeless tobacco: Never  ?Vaping Use  ? Vaping Use: Never used  ?Substance and Sexual Activity  ? Alcohol use: Never  ?  Alcohol/week: 0.0 standard drinks  ? Drug use: No  ? Sexual activity: Not Currently  ?Other Topics Concern  ? Not on file  ?Social History Narrative  ? Not on file  ? ?Social Determinants of Health  ? ?Financial Resource Strain: Not on file  ?Food Insecurity: Not on file  ?Transportation Needs: Not on  file  ?Physical Activity: Not on file  ?Stress: Not on file  ?Social Connections: Not on file  ?  ? ?Family History: ?The patient's family history includes AAA (abdominal aortic aneurysm) in her mother; Cancer in her father and maternal grandfather; Diabetes in her father and paternal grandmother; Heart attack in her paternal grandfather; Heart disease in her father and maternal grandmother; Hypertension in her father, sister, sister, and sister; Hypothyroidism in her father, sister, sister, and sister; Intracerebral hemorrhage in her mother; Leukemia in her father; Stroke in her maternal grandmother and mother. There is no history of Breast cancer. ? ?ROS:   ?Please see the history of present illness.    ? All other systems reviewed and are  negative. ? ?EKGs/Labs/Other Studies Reviewed:   ? ?The following studies were reviewed today: ? ? ?EKG:  EKG not ordered today.   ? ?Recent Labs: ?07/02/2021: BUN 15; Creatinine, Ser 0.59; Potassium 3.7; Sodium 137 ?03/05/2022: Hemoglobin 15.1; Platelets 250  ?Recent Lipid Panel ?   ?Component Value Date/Time  ? CHOL 118 04/18/2022 0833  ? CHOL 141 11/27/2021 0811  ? TRIG 66 04/18/2022 0833  ? HDL 72 04/18/2022 0833  ? HDL 83 11/27/2021 0811  ? CHOLHDL 1.6 04/18/2022 0833  ? VLDL 13 04/18/2022 0833  ? [place] 33 04/18/2022 0833  ? [place] 31 11/27/2021 0811  ? ? ? ?Risk Assessment/Calculations:   ? ? ? ?Physical Exam:   ? ?VS:  BP 110/62 (BP Location: Left Arm, Patient Position: Sitting, Cuff Size: Normal)   Pulse 68   Ht '5\' 3"'$  (1.6 m)   Wt 192 lb (87.1 kg)   BMI 34.01 kg/m?    ? ?Wt Readings from Last 3 Encounters:  ?04/18/22 192 lb (87.1 kg)  ?12/03/21 191 lb 6.4 oz (86.8 kg)  ?10/08/21 185 lb (83.9 kg)  ?  ? ?GEN:  Well nourished, well developed in no acute distress ?HEENT: Normal ?NECK: No JVD; No carotid bruits ?CARDIAC: RRR, no murmurs, rubs, gallops ?RESPIRATORY:  Clear to auscultation without rales, wheezing or rhonchi  ?ABDOMEN: Soft, non-tender, non-distended ?MUSCULOSKELETAL:  No edema; No deformity  ?SKIN: Warm and dry ?NEUROLOGIC:  Alert and oriented x 3 ?PSYCHIATRIC:  Normal affect  ? ?ASSESSMENT:   ? ?1. Coronary artery disease involving native coronary artery of native heart without angina pectoris   ?2. Mixed hyperlipidemia   ? ?PLAN:   ? ?In order of problems listed above: ? ?CAD, mild nonobstructive disease in the proximal LAD, coronary calcium score 192.  Denies chest pain.  Previous echo with normal function.  LDL at goal.  Continue Repatha.  Pravachol.  Not tolerant to aspirin, Plavix causing bruising.  ?Hyperlipidemia, LDL at goal.  Last triglycerides still elevated.  Obtain lipid panel today.  If elevated, plan to start fenofibrate and stop Pravachol.  Continue Repatha. ? ? ?Follow-up  yearly ?  ? ? ?Medication Adjustments/Labs and Tests Ordered: ?Current medicines are reviewed at length with the patient today.  Concerns regarding medicines are outlined above.  ?Orders Placed This Encounter  ?Procedures  ? Lipid panel  ? EKG 12-Lead  ? ? ? ?No orders of the defined types were placed in this encounter. ? ? ? ? ?Patient Instructions  ?Medication Instructions:  ? ?Your physician recommends that you continue on your current medications as directed. Please refer to the Current Medication list given to you today.  ? ?*If you need a refill on your cardiac medications before your next appointment, please call your pharmacy* ? ? ?  Lab Work: ? ?Get a fasting Lipid drawn today at the medical mall. ? ? ?Testing/Procedures: ? ?None ordered ? ? ?Follow-Up: ?At [facility], you and your health needs are our priority.  As part of our continuing mission to provide you with exceptional heart care, we have created designated Provider Care Teams.  These Care Teams include your primary Cardiologist (physician) and Advanced Practice Providers (APPs -  Physician Assistants and Nurse Practitioners) who all work together to provide you with the care you need, when you need it. ? ?We recommend signing up for the patient portal called "MyChart".  Sign up information is provided on this After Visit Summary.  MyChart is used to connect with patients for Virtual Visits (Telemedicine).  Patients are able to view lab/test results, encounter notes, upcoming appointments, etc.  Non-urgent messages can be sent to your provider as well.   ?To learn more about what you can do with MyChart, go to [email or web address].   ? ?Your next appointment:   ?1 year(s) ? ?The format for your next appointment:   ?In Person ? ?Provider:   ?You may see Kate Sable, MD or one of the following Advanced Practice Providers on your designated Care Team:   ?Murray Hodgkins, NP ?Christell Faith, PA-C ?Cadence Kathlen Mody, PA-C  ? ? ?Other  Instructions ? ? ?Important Information About Sugar ? ? ? ? ?   ? ?Signed, ?Kate Sable, MD  ?04/18/2022 11:47 AM    ?[place] ?

## 2022-05-02 ENCOUNTER — Encounter: Payer: Self-pay | Admitting: Cardiology

## 2022-05-10 ENCOUNTER — Other Ambulatory Visit: Payer: Self-pay | Admitting: *Deleted

## 2022-05-10 MED ORDER — PRAVASTATIN SODIUM 80 MG PO TABS: 80.0000 mg | ORAL_TABLET | Freq: Every day | ORAL | 3 refills | Status: DC

## 2022-09-20 ENCOUNTER — Ambulatory Visit
Admission: EM | Admit: 2022-09-20 | Discharge: 2022-09-20 | Disposition: A | Discharge status: Home / Self Care | Payer: Medicare Other | Attending: Urgent Care | Admitting: Urgent Care

## 2022-09-20 DIAGNOSIS — J018 Other acute sinusitis: Secondary | ICD-10-CM | POA: Diagnosis not present

## 2022-09-20 DIAGNOSIS — R062 Wheezing: Secondary | ICD-10-CM

## 2022-09-20 DIAGNOSIS — U071 COVID-19: Secondary | ICD-10-CM

## 2022-09-20 DIAGNOSIS — F17200 Nicotine dependence, unspecified, uncomplicated: Secondary | ICD-10-CM

## 2022-09-20 MED ORDER — PREDNISONE 50 MG PO TABS: 50.0000 mg | ORAL_TABLET | Freq: Every day | ORAL | 0 refills | Status: DC

## 2022-09-20 MED ORDER — ALBUTEROL SULFATE HFA 108 (90 BASE) MCG/ACT IN AERS: 1.0000 | INHALATION_SPRAY | Freq: Four times a day (QID) | RESPIRATORY_TRACT | 0 refills | Status: DC | PRN

## 2022-09-20 MED ORDER — CETIRIZINE HCL 10 MG PO TABS: 10.0000 mg | ORAL_TABLET | Freq: Every day | ORAL | 0 refills | Status: DC

## 2022-09-20 MED ORDER — AMOXICILLIN-POT CLAVULANATE 875-125 MG PO TABS: 1.0000 | ORAL_TABLET | Freq: Two times a day (BID) | ORAL | 0 refills | Status: DC

## 2022-09-20 NOTE — Discharge Instructions (Signed)
Megan Nichols, I am addressing her symptoms for secondary sinus infection from having COVID-19.  A good antibiotic for this is Augmentin, make sure you finish the entire 7-day course.  Given your smoking and wheezing we will also be using an oral prednisone course with Zyrtec.  Use the albuterol inhaler as needed.

## 2022-09-20 NOTE — ED Notes (Signed)
Pulse ox 97

## 2022-09-20 NOTE — ED Triage Notes (Signed)
Patient to Urgent Care with congestion/ wheezing, recently diagnosed with covid (Saturday). Negative home test this morning.   Reports still feeling a lot of congestion/ sinus congestion. Reports ongoing fatigue and generally feeling unwell.

## 2022-09-20 NOTE — ED Provider Notes (Signed)
[facility]  Note:  This document was prepared using Systems analyst and may include unintentional dictation errors.  MRN: [id number] DOB: 21-Jan-1956  Subjective:   Megan Nichols is a 66 y.o. female presenting for 8 to 9-day history of acute onset persistent and progressively worsening fatigue, malaise, congestion, sinus pressure, started having wheezing overnight.  She tested positive for COVID a week ago.  Did another test today and was negative.  Patient is a smoker, does 1 pack/day.  Patient would like an inhaler.  No overt chest pain or shortness of breath.  No current facility-administered medications for this encounter.  Current Outpatient Medications:    CALCIUM-VITAMIN D PO, Take by mouth. 2000 mg of vitamin D, Disp: , Rfl:    Evolocumab (REPATHA) 140 MG/ML SOSY, Inject 1 pen into the skin every 14 (fourteen) days., Disp: 2.1 mL, Rfl: 11   Multiple Vitamins-Minerals (PRESERVISION AREDS 2 PO), Take by mouth., Disp: , Rfl:    pravastatin (PRAVACHOL) 80 MG tablet, Take 1 tablet (80 mg total) by mouth at bedtime., Disp: 45 tablet, Rfl: 3   tiZANidine (ZANAFLEX) 2 MG tablet, Take 2 mg by mouth at bedtime., Disp: , Rfl:    Allergies  Allergen Reactions   Aspirin     Foaming at the mouth Other reaction(s): Other (See Comments) Foaming at the mouth    Past Medical History:  Diagnosis Date   Allergy    Anxiety    Arthritis    Degeneration of intervertebral disc of lumbar region    Depression    Hyperlipidemia    Impaired fasting glucose 05/21/2016   Irritable bowel syndrome with constipation    Obesity, Class I, BMI 30.0-34.9 (see actual BMI)    Pelvic floor dysfunction    Tobacco use 12/26/2015     Past Surgical History:  Procedure Laterality Date   APPENDECTOMY     BLADDER SURGERY     BREAST CYST ASPIRATION Left    1999   HEMORRHOID SURGERY     RECTOCELE REPAIR N/A 10/09/2018   Procedure: POSTERIOR REPAIR  (RECTOCELE);  Surgeon: Schermerhorn, Gwen Her, MD;  Location: [facility] ORS;  Service: Gynecology;  Laterality: N/A;   TONSILLECTOMY      Family History  Problem Relation Age of Onset   Diabetes Father    Heart disease Father    Hypothyroidism Father    Hypertension Father    Leukemia Father    Cancer Father    Intracerebral hemorrhage Mother    AAA (abdominal aortic aneurysm) Mother    Stroke Mother    Hypothyroidism Sister    Hypertension Sister    Stroke Maternal Grandmother    Heart disease Maternal Grandmother    Cancer Maternal Grandfather        ?   Diabetes Paternal Grandmother    Heart attack Paternal Grandfather    Hypothyroidism Sister    Hypertension Sister    Hypothyroidism Sister    Hypertension Sister    Breast cancer Neg Hx     Social History   Tobacco Use   Smoking status: Every Day    Packs/day: 1.00    Years: 30.00    Total pack years: 30.00    Types: Cigarettes   Smokeless tobacco: Never  Vaping Use   Vaping Use: Never used  Substance Use Topics   Alcohol use: Never    Alcohol/week: 0.0 standard drinks of alcohol   Drug use: No    ROS  Objective:   Vitals: BP 123/78   Pulse 80   Temp 97.9 F (36.6 C)   Resp 18   Ht '5\' 3"'$  (1.6 m)   Wt 194 lb (88 kg)   SpO2 97%   BMI 34.37 kg/m   Physical Exam Constitutional:      General: She is not in acute distress.    Appearance: Normal appearance. She is well-developed. She is not ill-appearing, toxic-appearing or diaphoretic.  HENT:     Head: Normocephalic and atraumatic.     Nose: Congestion present. No rhinorrhea.     Mouth/Throat:     Mouth: Mucous membranes are moist.     Pharynx: No oropharyngeal exudate or posterior oropharyngeal erythema.  Eyes:     General: No scleral icterus.       Right eye: No discharge.        Left eye: No discharge.     Extraocular Movements: Extraocular movements intact.  Cardiovascular:     Rate and Rhythm: Normal rate and regular rhythm.     Heart  sounds: Normal heart sounds. No murmur heard.    No friction rub. No gallop.  Pulmonary:     Effort: Pulmonary effort is normal. No respiratory distress.     Breath sounds: No stridor. No wheezing, rhonchi or rales.  Chest:     Chest wall: No tenderness.  Skin:    General: Skin is warm and dry.  Neurological:     General: No focal deficit present.     Mental Status: She is alert and oriented to person, place, and time.  Psychiatric:        Mood and Affect: Mood normal.        Behavior: Behavior normal.       Assessment and Plan :   PDMP not reviewed this encounter.  1. Other acute sinusitis, recurrence not specified   2. COVID-19 virus infection   3. Smoker   4. Wheezing    Deferred imaging given clear cardiopulmonary exam, hemodynamically stable vital signs. Will start empiric treatment for sinusitis with Augmentin.  Patient is a heavy smoker and has experienced wheezing, shortness of breath and therefore recommended oral prednisone course together with an albuterol inhaler. Counseled patient on potential for adverse effects with medications prescribed/recommended today, ER and return-to-clinic precautions discussed, patient verbalized understanding.    Jaynee Eagles, [place] 09/20/22 1238

## 2022-09-30 ENCOUNTER — Other Ambulatory Visit: Payer: Self-pay | Admitting: *Deleted

## 2022-09-30 MED ORDER — REPATHA 140 MG/ML ~~LOC~~ SOSY: 1.0000 "pen " | PREFILLED_SYRINGE | SUBCUTANEOUS | 6 refills | Status: DC

## 2022-10-04 ENCOUNTER — Telehealth: Payer: Self-pay

## 2022-10-04 ENCOUNTER — Telehealth: Payer: Self-pay | Admitting: Cardiology

## 2022-10-04 NOTE — Telephone Encounter (Signed)
The prior authorization for Repatha has been approved.   Response from CoverMyMeds:  This request has been approved. [employer]  will send a letter to the member and you regarding this decision. Please see additional information at the bottom of this request.  Effective from 10/04/2022 through 10/05/2023.

## 2022-10-04 NOTE — Telephone Encounter (Signed)
Pt c/o medication issue:  1. Name of Medication: Evolocumab (REPATHA) 140 MG/ML SOSY  2. How are you currently taking this medication (dosage and times per day)? Inject 1 pen into the skin every 14 (fourteen) days.  3. Are you having a reaction (difficulty breathing--STAT)? no  4. What is your medication issue? Calling to let us know that the patient has been approve for medication. Please advise

## 2022-10-04 NOTE — Telephone Encounter (Signed)
Prior Authorization initiated for Repatha '140MG'$ /Ml  via CoverMyMeds. KEY: [id number]  Response: Your information has been submitted to [place]. [employer] [place] will review the request and notify you of the determination decision directly, typically within 3 business days of your submission and once all necessary information is received.  You will also receive your request decision electronically. To check for an update later, open the request again from your dashboard.

## 2022-10-07 NOTE — Telephone Encounter (Signed)
Noted     Closing encounter

## 2022-10-08 ENCOUNTER — Ambulatory Visit
Admission: RE | Admit: 2022-10-08 | Discharge: 2022-10-08 | Disposition: A | Discharge status: Home / Self Care | Payer: Medicare Other | Source: Ambulatory Visit | Attending: Acute Care | Admitting: Acute Care

## 2022-10-08 DIAGNOSIS — F17210 Nicotine dependence, cigarettes, uncomplicated: Secondary | ICD-10-CM | POA: Diagnosis not present

## 2022-10-08 DIAGNOSIS — J439 Emphysema, unspecified: Secondary | ICD-10-CM | POA: Insufficient documentation

## 2022-10-08 DIAGNOSIS — I700 Atherosclerosis of aorta: Secondary | ICD-10-CM | POA: Diagnosis not present

## 2022-10-08 DIAGNOSIS — Z87891 Personal history of nicotine dependence: Secondary | ICD-10-CM

## 2022-10-08 DIAGNOSIS — Z122 Encounter for screening for malignant neoplasm of respiratory organs: Secondary | ICD-10-CM | POA: Insufficient documentation

## 2022-10-10 ENCOUNTER — Other Ambulatory Visit: Payer: Self-pay | Admitting: Acute Care

## 2022-10-10 DIAGNOSIS — Z87891 Personal history of nicotine dependence: Secondary | ICD-10-CM

## 2022-10-10 DIAGNOSIS — F17210 Nicotine dependence, cigarettes, uncomplicated: Secondary | ICD-10-CM

## 2022-10-10 DIAGNOSIS — Z122 Encounter for screening for malignant neoplasm of respiratory organs: Secondary | ICD-10-CM

## 2022-10-23 ENCOUNTER — Other Ambulatory Visit: Payer: Self-pay | Admitting: Family Medicine

## 2022-10-23 DIAGNOSIS — Z1231 Encounter for screening mammogram for malignant neoplasm of breast: Secondary | ICD-10-CM

## 2022-10-28 ENCOUNTER — Other Ambulatory Visit: Payer: Self-pay

## 2022-10-28 MED ORDER — PRAVASTATIN SODIUM 80 MG PO TABS: 80.0000 mg | ORAL_TABLET | Freq: Every day | ORAL | 1 refills | Status: DC

## 2022-11-29 ENCOUNTER — Ambulatory Visit
Admission: RE | Admit: 2022-11-29 | Discharge: 2022-11-29 | Disposition: A | Discharge status: Home / Self Care | Payer: Medicare Other | Source: Ambulatory Visit | Attending: Family Medicine | Admitting: Family Medicine

## 2022-11-29 DIAGNOSIS — Z1231 Encounter for screening mammogram for malignant neoplasm of breast: Secondary | ICD-10-CM | POA: Diagnosis not present

## 2022-12-09 ENCOUNTER — Encounter: Payer: Self-pay | Admitting: Dermatology

## 2022-12-09 ENCOUNTER — Ambulatory Visit: Payer: Medicare Other | Admitting: Dermatology

## 2022-12-09 VITALS — BP 116/70 | HR 69

## 2022-12-09 DIAGNOSIS — L814 Other melanin hyperpigmentation: Secondary | ICD-10-CM | POA: Diagnosis not present

## 2022-12-09 DIAGNOSIS — D2371 Other benign neoplasm of skin of right lower limb, including hip: Secondary | ICD-10-CM

## 2022-12-09 DIAGNOSIS — D229 Melanocytic nevi, unspecified: Secondary | ICD-10-CM | POA: Diagnosis not present

## 2022-12-09 DIAGNOSIS — L578 Other skin changes due to chronic exposure to nonionizing radiation: Secondary | ICD-10-CM

## 2022-12-09 DIAGNOSIS — L820 Inflamed seborrheic keratosis: Secondary | ICD-10-CM

## 2022-12-09 DIAGNOSIS — Z1283 Encounter for screening for malignant neoplasm of skin: Secondary | ICD-10-CM

## 2022-12-09 DIAGNOSIS — L821 Other seborrheic keratosis: Secondary | ICD-10-CM

## 2022-12-09 NOTE — Progress Notes (Signed)
   New Patient Visit  Subjective  Megan Nichols is a 66 y.o. female who presents for the following: Annual Exam (Here for skin cancer screening and to establish care. No personal Hx of skin cancer or dysplastic nevi). The patient presents for Total-Body Skin Exam (TBSE) for skin cancer screening and mole check.  The patient has spots, moles and lesions to be evaluated, some may be new or changing and the patient has concerns that these could be cancer.  Review of Systems: No other skin or systemic complaints except as noted in HPI or Assessment and Plan.  Objective  Well appearing patient in no apparent distress; mood and affect are within normal limits.  A full examination was performed including scalp, head, eyes, ears, nose, lips, neck, chest, axillae, abdomen, back, buttocks, bilateral upper extremities, bilateral lower extremities, hands, feet, fingers, toes, fingernails, and toenails. All findings within normal limits unless otherwise noted below.  Right Dorsum Forearm x1, left lateral nose/medial cheek x2, right elbow x1 (4) Erythematous keratotic or waxy stuck-on papule or plaque.   Assessment & Plan   Lentigines - Scattered tan macules - Due to sun exposure - Benign-appearing, observe - Recommend daily broad spectrum sunscreen SPF 30+ to sun-exposed areas, reapply every 2 hours as needed. - Call for any changes  Seborrheic Keratoses - Stuck-on, waxy, tan-brown papules and/or plaques  - Benign-appearing - Discussed benign etiology and prognosis. - Observe - Call for any changes  Melanocytic Nevi - Tan-brown and/or pink-flesh-colored symmetric macules and papules - Benign appearing on exam today - Observation - Call clinic for new or changing moles - Recommend daily use of broad spectrum spf 30+ sunscreen to sun-exposed areas.   Hemangiomas - Red papules - Discussed benign nature - Observe - Call for any changes  Actinic Damage - Chronic condition,  secondary to cumulative UV/sun exposure - diffuse scaly erythematous macules with underlying dyspigmentation - Recommend daily broad spectrum sunscreen SPF 30+ to sun-exposed areas, reapply every 2 hours as needed.  - Staying in the shade or wearing long sleeves, sun glasses (UVA+UVB protection) and wide brim hats (4-inch brim around the entire circumference of the hat) are also recommended for sun protection.  - Call for new or changing lesions.  Skin cancer screening performed today.  Dermatofibroma. Right lower leg. - Firm pink/brown papulenodule with dimple sign - Benign appearing - Call for any changes  Inflamed seborrheic keratosis (4) Right Dorsum Forearm x1, left lateral nose/medial cheek x2, right elbow x1  Symptomatic, irritating, patient would like treated.  Destruction of lesion - Right Dorsum Forearm x1, left lateral nose/medial cheek x2, right elbow x1 Complexity: simple   Destruction method: cryotherapy   Informed consent: discussed and consent obtained   Timeout:  patient name, date of birth, surgical site, and procedure verified Lesion destroyed using liquid nitrogen: Yes   Region frozen until ice ball extended beyond lesion: Yes   Outcome: patient tolerated procedure well with no complications   Post-procedure details: wound care instructions given   Additional details:  Prior to procedure, discussed risks of blister formation, small wound, skin dyspigmentation, or rare scar following cryotherapy. Recommend Vaseline ointment to treated areas while healing.    Return in about 1 year (around 12/10/2023) for TBSE, ISK Follow Up in 3-4 months.  I, Emelia Salisbury, CMA, am acting as scribe for Sarina Ser, MD. Documentation: I have reviewed the above documentation for accuracy and completeness, and I agree with the above.  Sarina Ser, MD

## 2022-12-09 NOTE — Patient Instructions (Addendum)
Cryotherapy Aftercare  Wash gently with soap and water everyday.   Apply Vaseline daily until healed.   Seborrheic Keratosis  What causes seborrheic keratoses? Seborrheic keratoses are harmless, common skin growths that first appear during adult life.  As time goes by, more growths appear.  Some people may develop a large number of them.  Seborrheic keratoses appear on both covered and uncovered body parts.  They are not caused by sunlight.  The tendency to develop seborrheic keratoses can be inherited.  They vary in color from skin-colored to gray, brown, or even black.  They can be either smooth or have a rough, warty surface.   Seborrheic keratoses are superficial and look as if they were stuck on the skin.  Under the microscope this type of keratosis looks like layers upon layers of skin.  That is why at times the top layer may seem to fall off, but the rest of the growth remains and re-grows.    Treatment Seborrheic keratoses do not need to be treated, but can easily be removed in the office.  Seborrheic keratoses often cause symptoms when they rub on clothing or jewelry.  Lesions can be in the way of shaving.  If they become inflamed, they can cause itching, soreness, or burning.  Removal of a seborrheic keratosis can be accomplished by freezing, burning, or surgery. If any spot bleeds, scabs, or grows rapidly, please return to have it checked, as these can be an indication of a skin cancer.  Recommend daily broad spectrum sunscreen SPF 30+ to sun-exposed areas, reapply every 2 hours as needed. Call for new or changing lesions.  Staying in the shade or wearing long sleeves, sun glasses (UVA+UVB protection) and wide brim hats (4-inch brim around the entire circumference of the hat) are also recommended for sun protection.    Melanoma ABCDEs  Melanoma is the most dangerous type of skin cancer, and is the leading cause of death from skin disease.  You are more likely to develop melanoma if  you: Have light-colored skin, light-colored eyes, or red or blond hair Spend a lot of time in the sun Tan regularly, either outdoors or in a tanning bed Have had blistering sunburns, especially during childhood Have a close family member who has had a melanoma Have atypical moles or large birthmarks  Early detection of melanoma is key since treatment is typically straightforward and cure rates are extremely high if we catch it early.   The first sign of melanoma is often a change in a mole or a new dark spot.  The ABCDE system is a way of remembering the signs of melanoma.  A for asymmetry:  The two halves do not match. B for border:  The edges of the growth are irregular. C for color:  A mixture of colors are present instead of an even brown color. D for diameter:  Melanomas are usually (but not always) greater than 6mm - the size of a pencil eraser. E for evolution:  The spot keeps changing in size, shape, and color.  Please check your skin once per month between visits. You can use a small mirror in front and a large mirror behind you to keep an eye on the back side or your body.   If you see any new or changing lesions before your next follow-up, please call to schedule a visit.  Please continue daily skin protection including broad spectrum sunscreen SPF 30+ to sun-exposed areas, reapplying every 2 hours as needed when   you're outdoors.   Staying in the shade or wearing long sleeves, sun glasses (UVA+UVB protection) and wide brim hats (4-inch brim around the entire circumference of the hat) are also recommended for sun protection.     Due to recent changes in healthcare laws, you may see results of your pathology and/or laboratory studies on MyChart before the doctors have had a chance to review them. We understand that in some cases there may be results that are confusing or concerning to you. Please understand that not all results are received at the same time and often the doctors  may need to interpret multiple results in order to provide you with the best plan of care or course of treatment. Therefore, we ask that you please give us 2 business days to thoroughly review all your results before contacting the office for clarification. Should we see a critical lab result, you will be contacted sooner.   If You Need Anything After Your Visit  If you have any questions or concerns for your doctor, please call our main line at [phone] and press option 4 to reach your doctor's medical assistant. If no one answers, please leave a voicemail as directed and we will return your call as soon as possible. Messages left after 4 pm will be answered the following business day.   You may also send us a message via MyChart. We typically respond to MyChart messages within 1-2 business days.  For prescription refills, please ask your pharmacy to contact our office. Our fax number is [phone].  If you have an urgent issue when the clinic is closed that cannot wait until the next business day, you can page your doctor at the number below.    Please note that while we do our best to be available for urgent issues outside of office hours, we are not available 24/7.   If you have an urgent issue and are unable to reach us, you may choose to seek medical care at your doctor's office, retail clinic, urgent care center, or emergency room.  If you have a medical emergency, please immediately call 911 or go to the emergency department.  Pager Numbers  - Dr. Kowalski: [phone]  - Dr. Moye: [phone]  - Dr. Stewart: [phone]  In the event of inclement weather, please call our main line at [phone] for an update on the status of any delays or closures.  Dermatology Medication Tips: Please keep the boxes that topical medications come in in order to help keep track of the instructions about where and how to use these. Pharmacies typically print the medication instructions  only on the boxes and not directly on the medication tubes.   If your medication is too expensive, please contact our office at [phone] option 4 or send us a message through MyChart.   We are unable to tell what your co-pay for medications will be in advance as this is different depending on your insurance coverage. However, we may be able to find a substitute medication at lower cost or fill out paperwork to get insurance to cover a needed medication.   If a prior authorization is required to get your medication covered by your insurance company, please allow us 1-2 business days to complete this process.  Drug prices often vary depending on where the prescription is filled and some pharmacies may offer cheaper prices.  The website www.goodrx.com contains coupons for medications through different pharmacies. The prices here do not account for what the cost   may be with help from insurance (it may be cheaper with your insurance), but the website can give you the price if you did not use any insurance.  - You can print the associated coupon and take it with your prescription to the pharmacy.  - You may also stop by our office during regular business hours and pick up a GoodRx coupon card.  - If you need your prescription sent electronically to a different pharmacy, notify our office through [facility] MyChart or by phone at [phone] option 4.     Si Usted Necesita Algo Despus de Su Visita  Tambin puede enviarnos un mensaje a travs de MyChart. Por lo general respondemos a los mensajes de MyChart en el transcurso de 1 a 2 das hbiles.  Para renovar recetas, por favor pida a su farmacia que se ponga en contacto con nuestra oficina. Nuestro nmero de fax es el [phone].  Si tiene un asunto urgente cuando la clnica est cerrada y que no puede esperar hasta el siguiente da hbil, puede llamar/localizar a su doctor(a) al nmero que aparece a continuacin.   Por favor, tenga en  cuenta que aunque hacemos todo lo posible para estar disponibles para asuntos urgentes fuera del horario de oficina, no estamos disponibles las 24 horas del da, los 7 das de la semana.   Si tiene un problema urgente y no puede comunicarse con nosotros, puede optar por buscar atencin mdica  en el consultorio de su doctor(a), en una clnica privada, en un centro de atencin urgente o en una sala de emergencias.  Si tiene una emergencia mdica, por favor llame inmediatamente al 911 o vaya a la sala de emergencias.  Nmeros de bper  - Dr. Kowalski: [phone]  - Dra. Moye: [phone]  - Dra. Stewart: [phone]  En caso de inclemencias del tiempo, por favor llame a nuestra lnea principal al [phone] para una actualizacin sobre el estado de cualquier retraso o cierre.  Consejos para la medicacin en dermatologa: Por favor, guarde las cajas en las que vienen los medicamentos de uso tpico para ayudarle a seguir las instrucciones sobre dnde y cmo usarlos. Las farmacias generalmente imprimen las instrucciones del medicamento slo en las cajas y no directamente en los tubos del medicamento.   Si su medicamento es muy caro, por favor, pngase en contacto con nuestra oficina llamando al [phone] y presione la opcin 4 o envenos un mensaje a travs de MyChart.   No podemos decirle cul ser su copago por los medicamentos por adelantado ya que esto es diferente dependiendo de la cobertura de su seguro. Sin embargo, es posible que podamos encontrar un medicamento sustituto a menor costo o llenar un formulario para que el seguro cubra el medicamento que se considera necesario.   Si se requiere una autorizacin previa para que su compaa de seguros cubra su medicamento, por favor permtanos de 1 a 2 das hbiles para completar este proceso.  Los precios de los medicamentos varan con frecuencia dependiendo del lugar de dnde se surte la receta y alguna farmacias pueden ofrecer  precios ms baratos.  El sitio web www.goodrx.com tiene cupones para medicamentos de diferentes farmacias. Los precios aqu no tienen en cuenta lo que podra costar con la ayuda del seguro (puede ser ms barato con su seguro), pero el sitio web puede darle el precio si no utiliz ningn seguro.  - Puede imprimir el cupn correspondiente y llevarlo con su receta a la farmacia.  - Tambin puede pasar por nuestra   oficina durante el horario de atencin regular y recoger una tarjeta de cupones de GoodRx.  - Si necesita que su receta se enve electrnicamente a una farmacia diferente, informe a nuestra oficina a travs de MyChart de [facility] o por telfono llamando al [phone] y presione la opcin 4.  

## 2022-12-17 ENCOUNTER — Encounter: Payer: Self-pay | Admitting: Dermatology

## 2023-03-06 ENCOUNTER — Inpatient Hospital Stay: Payer: Medicare Other | Attending: Oncology

## 2023-03-06 DIAGNOSIS — D751 Secondary polycythemia: Secondary | ICD-10-CM | POA: Insufficient documentation

## 2023-03-06 LAB — CBC WITH DIFFERENTIAL/PLATELET
Abs Immature Granulocytes: 0.14 10*3/uL — ABNORMAL HIGH (ref 0.00–0.07)
Basophils Absolute: 0.1 10*3/uL (ref 0.0–0.1)
Basophils Relative: 1 %
Eosinophils Absolute: 0.1 10*3/uL (ref 0.0–0.5)
Eosinophils Relative: 1 %
HCT: 40.7 % (ref 36.0–46.0)
Hemoglobin: 13.4 g/dL (ref 12.0–15.0)
Immature Granulocytes: 2 %
Lymphocytes Relative: 40 %
Lymphs Abs: 3.7 10*3/uL (ref 0.7–4.0)
MCH: 30.8 pg (ref 26.0–34.0)
MCHC: 32.9 g/dL (ref 30.0–36.0)
MCV: 93.6 fL (ref 80.0–100.0)
Monocytes Absolute: 0.9 10*3/uL (ref 0.1–1.0)
Monocytes Relative: 10 %
Neutro Abs: 4.4 10*3/uL (ref 1.7–7.7)
Neutrophils Relative %: 46 %
Platelets: 312 10*3/uL (ref 150–400)
RBC: 4.35 MIL/uL (ref 3.87–5.11)
RDW: 13.8 % (ref 11.5–15.5)
Smear Review: NORMAL
WBC: 9.3 10*3/uL (ref 4.0–10.5)
nRBC: 0 % (ref 0.0–0.2)

## 2023-03-10 ENCOUNTER — Encounter: Payer: Self-pay | Admitting: Oncology

## 2023-03-10 ENCOUNTER — Inpatient Hospital Stay (HOSPITAL_BASED_OUTPATIENT_CLINIC_OR_DEPARTMENT_OTHER): Payer: Medicare Other | Admitting: Oncology

## 2023-03-10 DIAGNOSIS — D751 Secondary polycythemia: Secondary | ICD-10-CM | POA: Diagnosis not present

## 2023-03-10 NOTE — Assessment & Plan Note (Addendum)
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 Marland Kitchen  JAK2 V617F mutation negative, with reflex to other mutations CALR, MPL, JAK 2 Ex 12-15 mutations negative. Elevated carbomonoxide level Hct is < 50, no need for phlebotomy.  Encourage smoke cessation efforts.

## 2023-03-10 NOTE — Progress Notes (Signed)
HEMATOLOGY-ONCOLOGY TeleHEALTH VISIT PROGRESS NOTE  I connected with Megan Nichols on 03/10/23  at  2:30 PM EST by video enabled telemedicine visit and verified that I am speaking with the correct person using two identifiers. I discussed the limitations, risks, security and privacy concerns of performing an evaluation and management service by telemedicine and the availability of in-person appointments. The patient expressed understanding and agreed to proceed.   Other persons participating in the visit and their role in the encounter:  None  Patient's location: Home  Provider's location: office Chief Complaint: erythrocytosis   INTERVAL HISTORY Megan Nichols is a 67 y.o. female who has above history reviewed by me today presents for follow up visit for management of secondary erythrocytosis Patient presents virtually. No new complaints except left flank cramping, she will discuss with her pcp She has been more active, exercise more, She has intentional weight loss. She also has been busy, and smoked less cigarettes.   Review of Systems  Constitutional:  Negative for appetite change, chills, fatigue and fever.  HENT:   Negative for hearing loss and voice change.   Eyes:  Negative for eye problems.  Respiratory:  Negative for chest tightness and cough.   Cardiovascular:  Negative for chest pain.  Gastrointestinal:  Negative for abdominal distention, abdominal pain and blood in stool.  Endocrine: Negative for hot flashes.  Genitourinary:  Negative for difficulty urinating and frequency.   Musculoskeletal:  Negative for arthralgias.  Skin:  Negative for itching and rash.  Neurological:  Negative for extremity weakness.  Hematological:  Negative for adenopathy.  Psychiatric/Behavioral:  Negative for confusion.      Past Medical History:  Diagnosis Date   Allergy    Anxiety    Arthritis    Degeneration of intervertebral disc of lumbar region    Depression     Hyperlipidemia    Impaired fasting glucose 05/21/2016   Irritable bowel syndrome with constipation    Obesity, Class I, BMI 30.0-34.9 (see actual BMI)    Pelvic floor dysfunction    Tobacco use 12/26/2015   Past Surgical History:  Procedure Laterality Date   APPENDECTOMY     BLADDER SURGERY     BREAST CYST ASPIRATION Left    1999   HEMORRHOID SURGERY     RECTOCELE REPAIR N/A 10/09/2018   Procedure: POSTERIOR REPAIR (RECTOCELE);  Surgeon: Schermerhorn, Gwen Her, MD;  Location: [facility] ORS;  Service: Gynecology;  Laterality: N/A;   TONSILLECTOMY      Family History  Problem Relation Age of Onset   Diabetes Father    Heart disease Father    Hypothyroidism Father    Hypertension Father    Leukemia Father    Cancer Father    Intracerebral hemorrhage Mother    AAA (abdominal aortic aneurysm) Mother    Stroke Mother    Hypothyroidism Sister    Hypertension Sister    Stroke Maternal Grandmother    Heart disease Maternal Grandmother    Cancer Maternal Grandfather        ?   Diabetes Paternal Grandmother    Heart attack Paternal Grandfather    Hypothyroidism Sister    Hypertension Sister    Hypothyroidism Sister    Hypertension Sister    Breast cancer Neg Hx     Social History   Socioeconomic History   Marital status: Single    Spouse name: Not on file   Number of children: Not on file   Years of education: Not on file  Highest education level: Not on file  Occupational History   Not on file  Tobacco Use   Smoking status: Every Day    Packs/day: 1.00    Years: 30.00    Total pack years: 30.00    Types: Cigarettes   Smokeless tobacco: Never  Vaping Use   Vaping Use: Never used  Substance and Sexual Activity   Alcohol use: Never    Alcohol/week: 0.0 standard drinks of alcohol   Drug use: No   Sexual activity: Not Currently  Other Topics Concern   Not on file  Social History Narrative   Not on file   Social Determinants of Health   Financial Resource Strain:  Not on file  Food Insecurity: Not on file  Transportation Needs: Not on file  Physical Activity: Not on file  Stress: Not on file  Social Connections: Not on file  Intimate Partner Violence: Not on file    Current Outpatient Medications on File Prior to Visit  Medication Sig Dispense Refill   CALCIUM-VITAMIN D PO Take by mouth. 2000 mg of vitamin D     Evolocumab (REPATHA) 140 MG/ML SOSY Inject 1 pen  into the skin every 14 (fourteen) days. 2 mL 6   Multiple Vitamins-Minerals (PRESERVISION AREDS 2 PO) Take by mouth.     pravastatin (PRAVACHOL) 80 MG tablet Take 1 tablet (80 mg total) by mouth at bedtime. 90 tablet 1   tiZANidine (ZANAFLEX) 2 MG tablet Take 2 mg by mouth at bedtime.     No current facility-administered medications on file prior to visit.    Allergies  Allergen Reactions   Aspirin     Foaming at the mouth Other reaction(s): Other (See Comments) Foaming at the mouth       Observations/Objective: There were no vitals filed for this visit.  There is no height or weight on file to calculate BMI.  Physical Exam Neurological:     Mental Status: She is alert.     Labs    Latest Ref Rng & Units 03/06/2023   10:50 AM 03/05/2022    7:50 AM 08/21/2021   10:35 AM  CBC  WBC 4.0 - 10.5 K/uL 9.3  6.0  6.4   Hemoglobin 12.0 - 15.0 g/dL 13.4  15.1  15.3   Hematocrit 36.0 - 46.0 % 40.7  44.8  44.9   Platelets 150 - 400 K/uL 312  250  232       Latest Ref Rng & Units 07/02/2021    6:11 AM 03/16/2021    9:59 AM 03/13/2021    6:20 AM  CMP  Glucose 70 - 99 mg/dL 90  92  110   BUN 8 - 23 mg/dL '15  15  21   '$ Creatinine 0.44 - 1.00 mg/dL 0.59  0.68  0.67   Sodium 135 - 145 mmol/L 137  137  137   Potassium 3.5 - 5.1 mmol/L 3.7  4.5  3.4   Chloride 98 - 111 mmol/L 102  100  105   CO2 22 - 32 mmol/L '25  23  25   '$ Calcium 8.9 - 10.3 mg/dL 8.6  9.5  8.9   Total Protein 6.5 - 8.1 g/dL   6.3   Total Bilirubin 0.3 - 1.2 mg/dL   0.8   Alkaline Phos 38 - 126 U/L   49   AST 15 - 41  U/L   18   ALT 0 - 44 U/L   15    ASSESSMENT &  PLAN:   Secondary erythrocytosis # 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 Marland Kitchen  JAK2 V617F mutation negative, with reflex to other mutations CALR, MPL, JAK 2 Ex 12-15 mutations negative. Elevated carbomonoxide level Hct is < 50, no need for phlebotomy.  Encourage smoke cessation efforts.   Orders Placed This Encounter  Procedures   CBC with Differential ([facility] Only)    Standing Status:   Future    Standing Expiration Date:   03/09/2024   Follow up in 12 months, patient prefers virtual visit.  All questions were answered. The patient knows to call the clinic with any problems, questions or concerns.  Earlie Server, MD, PhD [facility] Hematology Oncology 03/10/2023

## 2023-03-10 NOTE — Progress Notes (Signed)
Pt contacted for [place] visit. Pt reports cramping to left flank, but she will further discuss with PCP tomorrrow

## 2023-03-12 ENCOUNTER — Ambulatory Visit: Payer: Medicare Other | Admitting: Dermatology

## 2023-03-12 VITALS — BP 112/60 | HR 79

## 2023-03-12 DIAGNOSIS — L820 Inflamed seborrheic keratosis: Secondary | ICD-10-CM | POA: Diagnosis not present

## 2023-03-12 DIAGNOSIS — L578 Other skin changes due to chronic exposure to nonionizing radiation: Secondary | ICD-10-CM

## 2023-03-12 DIAGNOSIS — L821 Other seborrheic keratosis: Secondary | ICD-10-CM

## 2023-03-12 DIAGNOSIS — L570 Actinic keratosis: Secondary | ICD-10-CM | POA: Diagnosis not present

## 2023-03-12 DIAGNOSIS — R234 Changes in skin texture: Secondary | ICD-10-CM | POA: Diagnosis not present

## 2023-03-12 DIAGNOSIS — B078 Other viral warts: Secondary | ICD-10-CM

## 2023-03-12 NOTE — Progress Notes (Signed)
Follow-Up Visit   Subjective  Megan Nichols is a 67 y.o. female who presents for the following: Follow-up.  Patient presents for 3 month follow-up ISKs. She has residual areas on the left lateral nose/medial cheek. Scaly part has resolved, but area still raised. Has been raised for 5-6 years.  No previous surgery or injury to this area. No h/o bleeding. She also has growths on her chest and palms she would like checked/treated. Itchy and she scratches at.   She has upcoming surgery for deviated septum.   The following portions of the chart were reviewed this encounter and updated as appropriate:       Review of Systems:  No other skin or systemic complaints except as noted in HPI or Assessment and Plan.  Objective  Well appearing patient in no apparent distress; mood and affect are within normal limits.  A focused examination was performed including face, hands. Relevant physical exam findings are noted in the Assessment and Plan.  L mid sternum x 1 Pink scaly macule.  left upper paranasal Flesh slightly elevated slightly indurated thin nodule, 9.0 x 6.0 mm, appears as normal thickened skin. No history of bleeding to this area- present for years. Scaliness has resolved with cryotherapy last visit.       Left Palm x 6 (6) Verrucous flat scaly papules of the bilateral palms -- Discussed viral etiology and contagion.   lower sternum x 5 (5) Erythematous stuck-on, waxy papule     Assessment & Plan  Actinic Damage - chronic, secondary to cumulative UV radiation exposure/sun exposure over time - diffuse scaly erythematous macules with underlying dyspigmentation - Recommend daily broad spectrum sunscreen SPF 30+ to sun-exposed areas, reapply every 2 hours as needed.  - Recommend staying in the shade or wearing long sleeves, sun glasses (UVA+UVB protection) and wide brim hats (4-inch brim around the entire circumference of the hat). - Call for new or changing  lesions.  Seborrheic Keratoses - Stuck-on, waxy, tan-brown papules and/or plaques  - Benign-appearing - Discussed benign etiology and prognosis. - Observe - Call for any changes  AK (actinic keratosis) L mid sternum x 1  Actinic keratoses are precancerous spots that appear secondary to cumulative UV radiation exposure/sun exposure over time. They are chronic with expected duration over 1 year. A portion of actinic keratoses will progress to squamous cell carcinoma of the skin. It is not possible to reliably predict which spots will progress to skin cancer and so treatment is recommended to prevent development of skin cancer.  Recommend daily broad spectrum sunscreen SPF 30+ to sun-exposed areas, reapply every 2 hours as needed.  Recommend staying in the shade or wearing long sleeves, sun glasses (UVA+UVB protection) and wide brim hats (4-inch brim around the entire circumference of the hat). Call for new or changing lesions.  Destruction of lesion - L mid sternum x 1  Destruction method: cryotherapy   Informed consent: discussed and consent obtained   Lesion destroyed using liquid nitrogen: Yes   Region frozen until ice ball extended beyond lesion: Yes   Outcome: patient tolerated procedure well with no complications   Post-procedure details: wound care instructions given   Additional details:  Prior to procedure, discussed risks of blister formation, small wound, skin dyspigmentation, or rare scar following cryotherapy. Recommend Vaseline ointment to treated areas while healing.   Induration, skin left upper paranasal  Benign-appearing slightly thickened skin.  Observe for changes.  Call clinic for new or changing lesions.  Recommend daily  use of broad spectrum spf 30+ sunscreen to sun-exposed areas.   RTC if changing   Other viral warts (6) Left Palm x 6  Viral Wart (HPV) Counseling  Discussed viral / HPV (Human Papilloma Virus) etiology and risk of spread /infectivity to  other areas of body as well as to other people.  Multiple treatments and methods may be required to clear warts and it is possible treatment may not be successful.  Treatment risks include discoloration; scarring and there is still potential for wart recurrence.  Left palm treated today. Will treat right palm on follow-up. Once healed, may use OTC Salicylic acid nightly to aas.      Destruction of lesion - Left Palm x 6  Destruction method: cryotherapy   Informed consent: discussed and consent obtained   Lesion destroyed using liquid nitrogen: Yes   Region frozen until ice ball extended beyond lesion: Yes   Outcome: patient tolerated procedure well with no complications   Post-procedure details: wound care instructions given   Additional details:  Prior to procedure, discussed risks of blister formation, small wound, skin dyspigmentation, or rare scar following cryotherapy. Recommend Vaseline ointment to treated areas while healing.   Inflamed seborrheic keratosis (5) lower sternum x 5  Symptomatic, irritating, patient would like treated.  Destruction of lesion - lower sternum x 5  Destruction method: cryotherapy   Informed consent: discussed and consent obtained   Lesion destroyed using liquid nitrogen: Yes   Region frozen until ice ball extended beyond lesion: Yes   Outcome: patient tolerated procedure well with no complications   Post-procedure details: wound care instructions given   Additional details:  Prior to procedure, discussed risks of blister formation, small wound, skin dyspigmentation, or rare scar following cryotherapy. Recommend Vaseline ointment to treated areas while healing.    Return in about 1 month (around 04/12/2023) for warts.  IJamesetta Orleans, CMA, am acting as scribe for Brendolyn Patty, MD .  Documentation: I have reviewed the above documentation for accuracy and completeness, and I agree with the above.  Brendolyn Patty MD

## 2023-03-12 NOTE — Patient Instructions (Addendum)
Cryotherapy Aftercare  Wash gently with soap and water everyday.   Apply Vaseline and Band-Aid daily until healed.    Viral Warts & Molluscum Contagiosum  Viral warts and molluscum contagiosum are growths of the skin caused by viral infection of the skin. If you have been given the diagnosis of viral warts or molluscum contagiosum there are a few things that you must understand about your condition:  There is no guaranteed treatment method available for this condition. Multiple treatments may be required, The treatments may be time consuming and require multiple visits to the dermatology office. The treatment may be expensive. You will be charged each time you come into the office to have the spots treated. The treated areas may develop new lesions further complicating treatment. The treated areas may leave a scar. There is no guarantee that even after multiple treatments that the spots will be successfully treated. These are caused by a viral infection and can be spread to other areas of the skin and to other people by direct contact. Therefore, new spots may occur.   Seborrheic Keratosis  What causes seborrheic keratoses? Seborrheic keratoses are harmless, common skin growths that first appear during adult life.  As time goes by, more growths appear.  Some people may develop a large number of them.  Seborrheic keratoses appear on both covered and uncovered body parts.  They are not caused by sunlight.  The tendency to develop seborrheic keratoses can be inherited.  They vary in color from skin-colored to gray, brown, or even black.  They can be either smooth or have a rough, warty surface.   Seborrheic keratoses are superficial and look as if they were stuck on the skin.  Under the microscope this type of keratosis looks like layers upon layers of skin.  That is why at times the top layer may seem to fall off, but the rest of the growth remains and re-grows.    Treatment Seborrheic  keratoses do not need to be treated, but can easily be removed in the office.  Seborrheic keratoses often cause symptoms when they rub on clothing or jewelry.  Lesions can be in the way of shaving.  If they become inflamed, they can cause itching, soreness, or burning.  Removal of a seborrheic keratosis can be accomplished by freezing, burning, or surgery. If any spot bleeds, scabs, or grows rapidly, please return to have it checked, as these can be an indication of a skin cancer. Due to recent changes in healthcare laws, you may see results of your pathology and/or laboratory studies on MyChart before the doctors have had a chance to review them. We understand that in some cases there may be results that are confusing or concerning to you. Please understand that not all results are received at the same time and often the doctors may need to interpret multiple results in order to provide you with the best plan of care or course of treatment. Therefore, we ask that you please give us 2 business days to thoroughly review all your results before contacting the office for clarification. Should we see a critical lab result, you will be contacted sooner.   If You Need Anything After Your Visit  If you have any questions or concerns for your doctor, please call our main line at [phone] and press option 4 to reach your doctor's medical assistant. If no one answers, please leave a voicemail as directed and we will return your call as soon as possible. Messages left   after 4 pm will be answered the following business day.   You may also send us a message via MyChart. We typically respond to MyChart messages within 1-2 business days.  For prescription refills, please ask your pharmacy to contact our office. Our fax number is [phone].  If you have an urgent issue when the clinic is closed that cannot wait until the next business day, you can page your doctor at the number below.    Please note that while  we do our best to be available for urgent issues outside of office hours, we are not available 24/7.   If you have an urgent issue and are unable to reach us, you may choose to seek medical care at your doctor's office, retail clinic, urgent care center, or emergency room.  If you have a medical emergency, please immediately call 911 or go to the emergency department.  Pager Numbers  - Dr. Kowalski: [phone]  - Dr. Moye: [phone]  - Dr. Stewart: [phone]  In the event of inclement weather, please call our main line at [phone] for an update on the status of any delays or closures.  Dermatology Medication Tips: Please keep the boxes that topical medications come in in order to help keep track of the instructions about where and how to use these. Pharmacies typically print the medication instructions only on the boxes and not directly on the medication tubes.   If your medication is too expensive, please contact our office at [phone] option 4 or send us a message through MyChart.   We are unable to tell what your co-pay for medications will be in advance as this is different depending on your insurance coverage. However, we may be able to find a substitute medication at lower cost or fill out paperwork to get insurance to cover a needed medication.   If a prior authorization is required to get your medication covered by your insurance company, please allow us 1-2 business days to complete this process.  Drug prices often vary depending on where the prescription is filled and some pharmacies may offer cheaper prices.  The website www.goodrx.com contains coupons for medications through different pharmacies. The prices here do not account for what the cost may be with help from insurance (it may be cheaper with your insurance), but the website can give you the price if you did not use any insurance.  - You can print the associated coupon and take it with your prescription  to the pharmacy.  - You may also stop by our office during regular business hours and pick up a GoodRx coupon card.  - If you need your prescription sent electronically to a different pharmacy, notify our office through [facility] MyChart or by phone at [phone] option 4.     Si Usted Necesita Algo Despus de Su Visita  Tambin puede enviarnos un mensaje a travs de MyChart. Por lo general respondemos a los mensajes de MyChart en el transcurso de 1 a 2 das hbiles.  Para renovar recetas, por favor pida a su farmacia que se ponga en contacto con nuestra oficina. Nuestro nmero de fax es el [phone].  Si tiene un asunto urgente cuando la clnica est cerrada y que no puede esperar hasta el siguiente da hbil, puede llamar/localizar a su doctor(a) al nmero que aparece a continuacin.   Por favor, tenga en cuenta que aunque hacemos todo lo posible para estar disponibles para asuntos urgentes fuera del horario de oficina, no estamos disponibles   las 24 horas del da, los 7 das de la semana.   Si tiene un problema urgente y no puede comunicarse con nosotros, puede optar por buscar atencin mdica  en el consultorio de su doctor(a), en una clnica privada, en un centro de atencin urgente o en una sala de emergencias.  Si tiene una emergencia mdica, por favor llame inmediatamente al 911 o vaya a la sala de emergencias.  Nmeros de bper  - Dr. Kowalski: [phone]  - Dra. Moye: [phone]  - Dra. Stewart: [phone]  En caso de inclemencias del tiempo, por favor llame a nuestra lnea principal al [phone] para una actualizacin sobre el estado de cualquier retraso o cierre.  Consejos para la medicacin en dermatologa: Por favor, guarde las cajas en las que vienen los medicamentos de uso tpico para ayudarle a seguir las instrucciones sobre dnde y cmo usarlos. Las farmacias generalmente imprimen las instrucciones del medicamento slo en las cajas y no directamente en  los tubos del medicamento.   Si su medicamento es muy caro, por favor, pngase en contacto con nuestra oficina llamando al [phone] y presione la opcin 4 o envenos un mensaje a travs de MyChart.   No podemos decirle cul ser su copago por los medicamentos por adelantado ya que esto es diferente dependiendo de la cobertura de su seguro. Sin embargo, es posible que podamos encontrar un medicamento sustituto a menor costo o llenar un formulario para que el seguro cubra el medicamento que se considera necesario.   Si se requiere una autorizacin previa para que su compaa de seguros cubra su medicamento, por favor permtanos de 1 a 2 das hbiles para completar este proceso.  Los precios de los medicamentos varan con frecuencia dependiendo del lugar de dnde se surte la receta y alguna farmacias pueden ofrecer precios ms baratos.  El sitio web www.goodrx.com tiene cupones para medicamentos de diferentes farmacias. Los precios aqu no tienen en cuenta lo que podra costar con la ayuda del seguro (puede ser ms barato con su seguro), pero el sitio web puede darle el precio si no utiliz ningn seguro.  - Puede imprimir el cupn correspondiente y llevarlo con su receta a la farmacia.  - Tambin puede pasar por nuestra oficina durante el horario de atencin regular y recoger una tarjeta de cupones de GoodRx.  - Si necesita que su receta se enve electrnicamente a una farmacia diferente, informe a nuestra oficina a travs de MyChart de [place] o por telfono llamando al [phone] y presione la opcin 4.  

## 2023-03-19 ENCOUNTER — Ambulatory Visit: Payer: Medicare Other | Admitting: Dermatology

## 2023-03-20 ENCOUNTER — Ambulatory Visit: Payer: Medicare Other | Admitting: Dermatology

## 2023-03-27 LAB — COLOGUARD: COLOGUARD: NEGATIVE

## 2023-03-27 LAB — EXTERNAL GENERIC LAB PROCEDURE: COLOGUARD: NEGATIVE

## 2023-04-08 ENCOUNTER — Encounter: Payer: Self-pay | Admitting: Otolaryngology

## 2023-04-10 NOTE — Discharge Instructions (Signed)
[facility]  What is Functional Endoscopic Sinus Surgery?  The Surgery involves making the natural openings of the sinuses larger by removing the bony partitions that separate the sinuses from the nasal cavity.  The natural sinus lining is preserved as much as possible to allow the sinuses to resume normal function after the surgery.  In some patients nasal polyps (excessively swollen lining of the sinuses) may be removed to relieve obstruction of the sinus openings.  The surgery is performed through the nose using lighted scopes, which eliminates the need for incisions on the face.  A septoplasty is a different procedure which is sometimes performed with sinus surgery.  It involves straightening the boy partition that separates the two sides of your nose.  A crooked or deviated septum may need repair if is obstructing the sinuses or nasal airflow.  Turbinate reduction is also often performed during sinus surgery.  The turbinates are bony proturberances from the side walls of the nose which swell and can obstruct the nose in patients with sinus and allergy problems.  Their size can be surgically reduced to help relieve nasal obstruction.  What Can Sinus Surgery Do For Me?  Sinus surgery can reduce the frequency of sinus infections requiring antibiotic treatment.  This can provide improvement in nasal congestion, post-nasal drainage, facial pressure and nasal obstruction.  Surgery will NOT prevent you from ever having an infection again, so it usually only for patients who get infections 4 or more times yearly requiring antibiotics, or for infections that do not clear with antibiotics.  It will not cure nasal allergies, so patients with allergies may still require medication to treat their allergies after surgery. Surgery may improve headaches related to sinusitis, however, some people will continue to  require medication to control sinus headaches related to allergies.  Surgery will do nothing for other forms of headache (migraine, tension or cluster).  What Are the Risks of Endoscopic Sinus Surgery?  Current techniques allow surgery to be performed safely with little risk, however, there are rare complications that patients should be aware of.  Because the sinuses are located around the eyes, there is risk of eye injury, including blindness, though again, this would be quite rare. This is usually a result of bleeding behind the eye during surgery, which can effect vision, though there are treatments to protect the vision and prevent permanent injury. More serious complications would include bleeding inside the brain cavity or damage to the brain.This happens when the fluid around the brain leaks out into the sinus cavity.  Again, all of these complications are uncommon, and spinal fluid leaks can be safely managed surgically if they occur.  The most common complication of sinus surgery is bleeding from the nose, which may require packing or cauterization of the nose.  Patients with polyps may experience recurrence of the polyps that would require revision surgery.  Alterations of sense of smell or injury to the tear ducts are also rare complications.   What is the Surgery Like, and what is the Recovery?  The Surgery usually takes a couple of hours to perform, and is usually performed under a general anesthetic (completely asleep).  Patients are usually discharged home after a couple of hours.  Sometimes during surgery it is necessary to pack the nose to control bleeding, and the packing is left in place for 24 - 48 hours, and removed by your surgeon.  If   a septoplasty was performed during the procedure, there is often a splint placed which must be removed after 5-7 days.   Discomfort: Pain is usually mild to moderate, and can be controlled by prescription pain medication or acetaminophen (Tylenol).   Aspirin, Ibuprofen (Advil, Motrin), or Naprosyn (Aleve) should be avoided, as they can cause increased bleeding.  Most patients feel sinus pressure like they have a bad head cold for several days.  Sleeping with your head elevated can help reduce swelling and facial pressure, as can ice packs over the face.  A humidifier may be helpful to keep the mucous and blood from drying in the nose.   Diet: There are no specific diet restrictions, however, you should generally start with clear liquids and a light diet of bland foods because the anesthetic can cause some nausea.  Advance your diet depending on how your stomach feels.  Taking your pain medication with food will often help reduce stomach upset which pain medications can cause.  Nasal Saline Irrigation: It is important to remove blood clots and dried mucous from the nose as it is healing.  This is done by having you irrigate the nose at least 3 - 4 times daily with a salt water solution.  We recommend using NeilMed Sinus Rinse (available at the drug store).  Fill the squeeze bottle with the solution, bend over a sink, and insert the tip of the squeeze bottle into the nose  of an inch.  Point the tip of the squeeze bottle towards the inside corner of the eye on the same side your irrigating.  Squeeze the bottle and gently irrigate the nose.  If you bend forward as you do this, most of the fluid will flow back out of the nose, instead of down your throat.   The solution should be warm, near body temperature, when you irrigate.   Each time you irrigate, you should use a full squeeze bottle.   Note that if you are instructed to use Nasal Steroid Sprays at any time after your surgery, irrigate with saline BEFORE using the steroid spray, so you do not wash it all out of the nose. Another product, Nasal Saline Gel (such as AYR Nasal Saline Gel) can be applied in each nostril 3 - 4 times daily to moisture the nose and reduce scabbing or crusting.  Bleeding:   Bloody drainage from the nose can be expected for several days, and patients are instructed to irrigate their nose frequently with salt water to help remove mucous and blood clots.  The drainage may be dark red or brown, though some fresh blood may be seen intermittently, especially after irrigation.  Do not blow you nose, as bleeding may occur. If you must sneeze, keep your mouth open to allow air to escape through your mouth.  If heavy bleeding occurs: Irrigate the nose with saline to rinse out clots, then spray the nose 3 - 4 times with Afrin Nasal Decongestant Spray.  The spray will constrict the blood vessels to slow bleeding.  Pinch the lower half of your nose shut to apply pressure, and lay down with your head elevated.  Ice packs over the nose may help as well. If bleeding persists despite these measures, you should notify your doctor.  Do not use the Afrin routinely to control nasal congestion after surgery, as it can result in worsening congestion and may affect healing.     Activity: Return to work varies among patients. Most patients will be out   of work at least 5 - 7 days to recover.  Patient may return to work after they are off of narcotic pain medication, and feeling well enough to perform the functions of their job.  Patients must avoid heavy lifting (over 10 pounds) or strenuous physical for 2 weeks after surgery, so your employer may need to assign you to light duty, or keep you out of work longer if light duty is not possible.  NOTE: you should not drive, operate dangerous machinery, do any mentally demanding tasks or make any important legal or financial decisions while on narcotic pain medication and recovering from the general anesthetic.    Call Your Doctor Immediately if You Have Any of the Following: Bleeding that you cannot control with the above measures Loss of vision, double vision, bulging of the eye or black eyes. Fever over 101 degrees Neck stiffness with severe headache,  fever, nausea and change in mental state. You are always encouraged to call anytime with concerns, however, please call with requests for pain medication refills during office hours.  Office Endoscopy: During follow-up visits your doctor will remove any packing or splints that may have been placed and evaluate and clean your sinuses endoscopically.  Topical anesthetic will be used to make this as comfortable as possible, though you may want to take your pain medication prior to the visit.  How often this will need to be done varies from patient to patient.  After complete recovery from the surgery, you may need follow-up endoscopy from time to time, particularly if there is concern of recurrent infection or nasal polyps.  

## 2023-04-13 ENCOUNTER — Other Ambulatory Visit
Admission: RE | Admit: 2023-04-13 | Discharge: 2023-04-13 | Disposition: A | Discharge status: Home / Self Care | Payer: Medicare Other | Source: Ambulatory Visit | Attending: Family Medicine | Admitting: Family Medicine

## 2023-04-13 DIAGNOSIS — R1084 Generalized abdominal pain: Secondary | ICD-10-CM | POA: Insufficient documentation

## 2023-04-13 LAB — COMPREHENSIVE METABOLIC PANEL
ALT: 18 U/L (ref 0–44)
AST: 19 U/L (ref 15–41)
Albumin: 4 g/dL (ref 3.5–5.0)
Alkaline Phosphatase: 58 U/L (ref 38–126)
Anion gap: 7 (ref 5–15)
BUN: 21 mg/dL (ref 8–23)
CO2: 26 mmol/L (ref 22–32)
Calcium: 9.3 mg/dL (ref 8.9–10.3)
Chloride: 104 mmol/L (ref 98–111)
Creatinine, Ser: 0.56 mg/dL (ref 0.44–1.00)
GFR, Estimated: >60 mL/min (ref >60)
Glucose, Bld: 93 mg/dL (ref 70–99)
Potassium: 3.8 mmol/L (ref 3.5–5.1)
Sodium: 137 mmol/L (ref 135–145)
Total Bilirubin: 0.5 mg/dL (ref 0.3–1.2)
Total Protein: 6.5 g/dL (ref 6.5–8.1)

## 2023-04-17 ENCOUNTER — Encounter
Admission: RE | Disposition: A | Discharge status: Home / Self Care | Payer: Self-pay | Source: Ambulatory Visit | Attending: Otolaryngology

## 2023-04-17 ENCOUNTER — Ambulatory Visit: Payer: Medicare Other | Admitting: Anesthesiology

## 2023-04-17 ENCOUNTER — Ambulatory Visit
Admission: RE | Admit: 2023-04-17 | Discharge: 2023-04-17 | Disposition: A | Discharge status: Home / Self Care | Payer: Medicare Other | Source: Ambulatory Visit | Attending: Otolaryngology | Admitting: Otolaryngology

## 2023-04-17 ENCOUNTER — Encounter: Payer: Self-pay | Admitting: Otolaryngology

## 2023-04-17 ENCOUNTER — Other Ambulatory Visit: Payer: Self-pay

## 2023-04-17 DIAGNOSIS — J343 Hypertrophy of nasal turbinates: Secondary | ICD-10-CM | POA: Diagnosis not present

## 2023-04-17 DIAGNOSIS — J342 Deviated nasal septum: Secondary | ICD-10-CM | POA: Diagnosis present

## 2023-04-17 DIAGNOSIS — M1990 Unspecified osteoarthritis, unspecified site: Secondary | ICD-10-CM | POA: Diagnosis not present

## 2023-04-17 DIAGNOSIS — G709 Myoneural disorder, unspecified: Secondary | ICD-10-CM | POA: Insufficient documentation

## 2023-04-17 DIAGNOSIS — F17200 Nicotine dependence, unspecified, uncomplicated: Secondary | ICD-10-CM | POA: Insufficient documentation

## 2023-04-17 DIAGNOSIS — J3489 Other specified disorders of nose and nasal sinuses: Secondary | ICD-10-CM | POA: Diagnosis not present

## 2023-04-17 HISTORY — PX: ENDOSCOPIC CONCHA BULLOSA RESECTION: SHX6395

## 2023-04-17 HISTORY — DX: Family history of other specified conditions: Z84.89

## 2023-04-17 HISTORY — DX: Failed or difficult intubation, initial encounter: T88.4XXA

## 2023-04-17 HISTORY — PX: SEPTOPLASTY: SHX2393

## 2023-04-17 HISTORY — PX: NASAL TURBINATE REDUCTION: SHX2072

## 2023-04-17 SURGERY — SEPTOPLASTY, NOSE
Anesthesia: General | Site: Nose | Laterality: Right

## 2023-04-17 MED ORDER — OXYMETAZOLINE HCL 0.05 % NA SOLN: 2.0000 | Freq: Once | NASAL | Status: DC

## 2023-04-17 MED ORDER — FENTANYL CITRATE (PF) 100 MCG/2ML IJ SOLN
INTRAMUSCULAR | Status: DC | PRN
  Administered 2023-04-17: 25 ug via INTRAVENOUS
  Administered 2023-04-17: 75 ug via INTRAVENOUS

## 2023-04-17 MED ORDER — PHENYLEPHRINE HCL 0.5 % NA SOLN
NASAL | Status: DC | PRN
  Administered 2023-04-17: 15 mL via TOPICAL

## 2023-04-17 MED ORDER — LACTATED RINGERS IV SOLN: INTRAVENOUS | Status: DC

## 2023-04-17 MED ORDER — HYDROCODONE-ACETAMINOPHEN 5-325 MG PO TABS: 1.0000 | ORAL_TABLET | Freq: Four times a day (QID) | ORAL | 0 refills | Status: AC | PRN

## 2023-04-17 MED ORDER — EPHEDRINE SULFATE (PRESSORS) 50 MG/ML IJ SOLN
INTRAMUSCULAR | Status: DC | PRN
  Administered 2023-04-17 (×3): 10 mg via INTRAVENOUS

## 2023-04-17 MED ORDER — DEXAMETHASONE SODIUM PHOSPHATE 4 MG/ML IJ SOLN
INTRAMUSCULAR | Status: DC | PRN
  Administered 2023-04-17: 8 mg via INTRAVENOUS

## 2023-04-17 MED ORDER — SUCCINYLCHOLINE CHLORIDE 200 MG/10ML IV SOSY
PREFILLED_SYRINGE | INTRAVENOUS | Status: DC | PRN
  Administered 2023-04-17: 100 mg via INTRAVENOUS

## 2023-04-17 MED ORDER — DEXTROSE 5 % IV SOLN
2000.0000 mg | Freq: Once | INTRAVENOUS | Status: AC
  Administered 2023-04-17: 2000 mg via INTRAVENOUS

## 2023-04-17 MED ORDER — ACETAMINOPHEN 10 MG/ML IV SOLN: 1000.0000 mg | Freq: Once | INTRAVENOUS | Status: DC

## 2023-04-17 MED ORDER — PROPOFOL 10 MG/ML IV BOLUS
INTRAVENOUS | Status: DC | PRN
  Administered 2023-04-17: 50 mg via INTRAVENOUS
  Administered 2023-04-17: 150 mg via INTRAVENOUS

## 2023-04-17 MED ORDER — LIDOCAINE-EPINEPHRINE 1 %-1:100000 IJ SOLN
INTRAMUSCULAR | Status: DC | PRN
  Administered 2023-04-17: 5 mL

## 2023-04-17 MED ORDER — LIDOCAINE HCL (CARDIAC) PF 100 MG/5ML IV SOSY
PREFILLED_SYRINGE | INTRAVENOUS | Status: DC | PRN
  Administered 2023-04-17: 30 mg via INTRAVENOUS

## 2023-04-17 MED ORDER — MIDAZOLAM HCL 5 MG/5ML IJ SOLN
INTRAMUSCULAR | Status: DC | PRN
  Administered 2023-04-17: 2 mg via INTRAVENOUS

## 2023-04-17 MED ORDER — CEPHALEXIN 500 MG PO CAPS: 500.0000 mg | ORAL_CAPSULE | Freq: Two times a day (BID) | ORAL | 0 refills | Status: DC

## 2023-04-17 MED ORDER — PREDNISONE 10 MG PO TABS: ORAL_TABLET | ORAL | 0 refills | Status: DC

## 2023-04-17 MED ORDER — ONDANSETRON HCL 4 MG/2ML IJ SOLN
INTRAMUSCULAR | Status: DC | PRN
  Administered 2023-04-17: 4 mg via INTRAVENOUS

## 2023-04-17 SURGICAL SUPPLY — 30 items
CANISTER SUCT 1200ML W/VALVE (MISCELLANEOUS) ×2 IMPLANT
CATH IV 18X1 1/4 SAFELET (CATHETERS) ×2 IMPLANT
COAGULATOR SUCT 8FR VV (MISCELLANEOUS) ×2 IMPLANT
DRAPE HEAD BAR (DRAPES) ×2 IMPLANT
ELECT REM PT RETURN 9FT ADLT (ELECTROSURGICAL) ×2
ELECTRODE REM PT RTRN 9FT ADLT (ELECTROSURGICAL) ×2 IMPLANT
GLOVE SURG GAMMEX PI TX LF 7.5 (GLOVE) ×4 IMPLANT
GOWN STRL REUS W/ TWL LRG LVL3 (GOWN DISPOSABLE) ×2 IMPLANT
GOWN STRL REUS W/TWL LRG LVL3 (GOWN DISPOSABLE) ×2
IV CATH 18X1 1/4 SAFELET (CATHETERS) ×2
KIT TURNOVER KIT A (KITS) ×2 IMPLANT
NDL ANESTHESIA 27G X 3.5 (NEEDLE) ×2 IMPLANT
NDL HYPO 27GX1-1/4 (NEEDLE) ×2 IMPLANT
NEEDLE ANESTHESIA  27G X 3.5 (NEEDLE) ×2
NEEDLE ANESTHESIA 27G X 3.5 (NEEDLE) ×2 IMPLANT
NEEDLE HYPO 27GX1-1/4 (NEEDLE) ×2 IMPLANT
NS IRRIG 500ML POUR BTL (IV SOLUTION) ×2 IMPLANT
PACK ENT CUSTOM (PACKS) ×2 IMPLANT
PACKING NASAL EPIS 4X2.4 XEROG (MISCELLANEOUS) ×2 IMPLANT
PATTIES SURGICAL .5 X3 (DISPOSABLE) ×2 IMPLANT
SPLINT NASAL SEPTAL BLV .50 ST (MISCELLANEOUS) ×2 IMPLANT
STRAP BODY AND KNEE 60X3 (MISCELLANEOUS) ×4 IMPLANT
SUT CHROMIC 3-0 (SUTURE) ×2
SUT CHROMIC 3-0 KS 27XMFL CR (SUTURE) ×2
SUT ETHILON 3-0 KS 30 BLK (SUTURE) ×2 IMPLANT
SUT PLAIN GUT 4-0 (SUTURE) ×2 IMPLANT
SUTURE CHRMC 3-0 KS 27XMFL CR (SUTURE) IMPLANT
SYR 3ML LL SCALE MARK (SYRINGE) ×2 IMPLANT
TOWEL OR 17X26 4PK STRL BLUE (TOWEL DISPOSABLE) ×2 IMPLANT
WATER STERILE IRR 250ML POUR (IV SOLUTION) ×2 IMPLANT

## 2023-04-17 NOTE — Transfer of Care (Signed)
Immediate Anesthesia Transfer of Care Note  Patient: Megan Nichols  Procedure(s) Performed: SEPTOPLASTY (Nose) INFERIOR TURBINATE REDUCTION (Right: Nose) ENDOSCOPIC CONCHA BULLOSA RESECTION (Right: Nose)  Patient Location: PACU  Anesthesia Type: General ETT  Level of Consciousness: awake, alert  and patient cooperative  Airway and Oxygen Therapy: Patient Spontanous Breathing and Patient connected to supplemental oxygen  Post-op Assessment: Post-op Vital signs reviewed, Patient's Cardiovascular Status Stable, Respiratory Function Stable, Patent Airway and No signs of Nausea or vomiting  Post-op Vital Signs: Reviewed and stable  Complications:  Encounter Notable Events  Notable Event Outcome Phase Comment  Difficult to intubate - expected  Intraprocedure Filed from anesthesia note documentation.

## 2023-04-17 NOTE — Anesthesia Preprocedure Evaluation (Signed)
Anesthesia Evaluation  Patient identified by MRN, date of birth, ID band Patient awake    Reviewed: Allergy & Precautions, H&P , NPO status , Patient's Chart, lab work & pertinent test results  History of Anesthesia Complications (+) Family history of anesthesia reaction  Airway Mallampati: IV  TM Distance: <3 FB Neck ROM: Full    Dental   Multiple crowned teeth, all upper incisors:   Pulmonary neg pulmonary ROS, Current SmokerPatient did not abstain from smoking.   Pulmonary exam normal breath sounds clear to auscultation       Cardiovascular negative cardio ROS Normal cardiovascular exam Rhythm:Regular Rate:Normal     Neuro/Psych  PSYCHIATRIC DISORDERS Anxiety Depression     Neuromuscular disease negative neurological ROS  negative psych ROS   GI/Hepatic negative GI ROS, Neg liver ROS,,,  Endo/Other  negative endocrine ROS    Renal/GU negative Renal ROS  negative genitourinary   Musculoskeletal negative musculoskeletal ROS (+) Arthritis ,    Abdominal   Peds negative pediatric ROS (+)  Hematology negative hematology ROS (+)   Anesthesia Other Findings Allergy  Arthritis Hyperlipidemia Obesity, Class I, BMI 30.0-34.9 (see actual BMI) Irritable bowel syndrome with constipation Degeneration of intervertebral disc of lumbar region Pelvic floor dysfunction Sister has PONV; patient denies PONV herself Consider videolaryngoscope; airway likely anterior Impaired fasting glucose Tobacco use    Reproductive/Obstetrics negative OB ROS                              Anesthesia Physical Anesthesia Plan  ASA: 2  Anesthesia Plan: General ETT   Post-op Pain Management:    Induction: Intravenous  PONV Risk Score and Plan:   Airway Management Planned: Oral ETT  Additional Equipment:   Intra-op Plan:   Post-operative Plan: Extubation in OR  Informed Consent: I have reviewed the  patients History and Physical, chart, labs and discussed the procedure including the risks, benefits and alternatives for the proposed anesthesia with the patient or authorized representative who has indicated his/her understanding and acceptance.     Dental Advisory Given  Plan Discussed with: Anesthesiologist, CRNA and Surgeon  Anesthesia Plan Comments: (Consider videolaryngoscope; airway likely anterior  Patient consented for risks of anesthesia including but not limited to:  - adverse reactions to medications - damage to eyes, teeth, lips or other oral mucosa - nerve damage due to positioning  - sore throat or hoarseness - Damage to heart, brain, nerves, lungs, other parts of body or loss of life  Patient voiced understanding.)         Anesthesia Quick Evaluation

## 2023-04-17 NOTE — H&P (Signed)
H&P has been reviewed and patient reevaluated, no changes necessary. To be downloaded later.  

## 2023-04-17 NOTE — Anesthesia Procedure Notes (Addendum)
Procedure Name: Intubation Date/Time: 04/17/2023 8:22 AM  Performed by: Andee Poles, CRNAPre-anesthesia Checklist: Patient identified, Emergency Drugs available, Suction available, Patient being monitored and Timeout performed Patient Re-evaluated:Patient Re-evaluated prior to induction Oxygen Delivery Method: Circle system utilized Preoxygenation: Pre-oxygenation with 100% oxygen Induction Type: IV induction Ventilation: Mask ventilation without difficulty Laryngoscope Size: Mac, 3 and McGraph Grade View: Grade III Tube type: Oral Rae Tube size: 7.0 mm Number of attempts: 1 Airway Equipment and Method: LTA kit utilized and Video-laryngoscopy Placement Confirmation: ETT inserted through vocal cords under direct vision, positive ETCO2 and breath sounds checked- equal and bilateral Tube secured with: Tape Dental Injury: Teeth and Oropharynx as per pre-operative assessment  Difficulty Due To: Difficulty was anticipated and Difficult Airway- due to anterior larynx Comments: Attempt with mac 3 grade 3 view. Magrath 3 blade one attempt successful

## 2023-04-17 NOTE — Op Note (Signed)
04/17/2023  9:22 AM [id number]   Pre-Op Dx:  Deviated Nasal Septum, Hypertrophic Inferior Turbinates, concha bullosa of right middle turbinate  Post-op Dx: Same  Proc: Nasal Septoplasty, Partial Reduction right inferior Turbinate, endoscopic trimming of concha bullosa right middle turbinate  Surg:  Megan Nichols  Anes:  GOT  EBL: 50 mL  Comp: None  Findings: Septum markedly deviated to the left side with a spur that was sticking into the sidewall and inferior turbinate.  Procedure: With the patient in a comfortable supine position,  general orotracheal anesthesia was induced without difficulty.     The patient received preoperative Afrin spray for topical decongestion and vasoconstriction.  Intravenous prophylactic antibiotics were administered.  At an appropriate level, the patient was placed in a semi-sitting position.  Nasal vibrissae were trimmed.   1% Xylocaine with 1:100,000 epinephrine, 7 cc's, was infiltrated into the anterior floor of the nose, into the nasal spine region, into the membranous columella, and finally into the submucoperichondrial plane of the septum on both sides.  Several minutes were allowed for this to take effect.  Cottoniod pledgetts soaked in Afrin and 4% Xylocaine were placed into both nasal cavities and left while the patient was prepped and draped in the standard fashion.  The materials were removed from the nose and observed to be intact and correct in number.  The nose was inspected with a headlight and zero degree scope with the findings as described above.  A left Killian incision was sharply executed and carried down to the quadrangular cartilage. The mucoperichondrium was elelvated along the quadrangular plate back to the bony-cartilaginous junction. The mucoperiostium was then elevated along the ethmoid plate and the vomer. The boney-catilaginous junction was then split with a freer elevator and the mucoperiosteum was elevated on the opposite side.  The mucoperiosteum was then elevated along the maxillary crest as needed to expose the crooked bone of the crest.  Boney spurs of the vomer and maxillary crest were removed with Lenoria Chime forceps.  There is small tear of mucosa on the left side of the septum and posteriorly where the spur was sticking out so far on the left side.  The cartilaginous plate was trimmed along its posterior and inferior borders of about 2 mm of cartilage to free it up inferiorly. Some of the deviated ethmoid plate was then fractured and removed with Takahashi forceps to free up the posterior border of the quadrangular plate and allow it to swing back to the midline. The mucosal flaps were placed back into their anatomic position to allow visualization of the airways. The septum now sat in the midline with an improved airway.  A 3-0 Chromic suture on a Keith needle in used to anchor the inferior septum at the nasal spine with a through and through suture. The mucosal flaps are then sutured together using a through and through whip stitch of 4-0 Plain Gut with a mini-Keith needle.  This helped close the mucosal tear on the left side.  This was used to close the [place] incision as well.   The right inferior turbinate was then inspected. An incision was created along the inferior aspect of the right inferior turbinate with removal of some of the inferior soft tissue and bone. Electrocautery was used to control bleeding in the area. The remaining turbinate was then outfractured to open up the airway further. There was no significant bleeding noted.  The airways were then visualized and showed open passageways on both sides that were significantly  improved compared to before surgery. There was no signifcant bleeding. Nasal splints were applied to both sides of the septum using Xomed 0.24mm regular sized splints that were trimmed, and then held in position with a 3-0 Nylon through and through suture.  The patient was turned back over  to anesthesia, and awakened, extubated, and taken to the PACU in satisfactory condition.  Dispo:   PACU to home  Plan: Ice, elevation, narcotic analgesia, steroid taper, and prophylactic antibiotics for the duration of indwelling nasal foreign bodies.  We will reevaluate the patient in the office in 6 days and remove the septal splints.  Return to work in 10 days, strenuous activities in two weeks.   Megan Nichols 04/17/2023 9:22 AM

## 2023-04-17 NOTE — Anesthesia Postprocedure Evaluation (Signed)
Anesthesia Post Note  Patient: Megan Nichols  Procedure(s) Performed: SEPTOPLASTY (Nose) INFERIOR TURBINATE REDUCTION (Right: Nose) ENDOSCOPIC CONCHA BULLOSA RESECTION (Right: Nose)  Patient location during evaluation: PACU Anesthesia Type: General Level of consciousness: awake and alert Pain management: pain level controlled Vital Signs Assessment: post-procedure vital signs reviewed and stable Respiratory status: spontaneous breathing, nonlabored ventilation, respiratory function stable and patient connected to nasal cannula oxygen Cardiovascular status: blood pressure returned to baseline and stable Postop Assessment: no apparent nausea or vomiting Anesthetic complications: yes   Encounter Notable Events  Notable Event Outcome Phase Comment  Difficult to intubate - expected  Intraprocedure Filed from anesthesia note documentation.     Last Vitals:  Vitals:   04/17/23 0945 04/17/23 0946  BP:  (!) 141/76  Pulse: 74 77  Resp: 15 15  Temp:  36.6 C  SpO2: 90% 95%    Last Pain:  Vitals:   04/17/23 0946  TempSrc:   PainSc: 0-No pain                 Marisue Humble

## 2023-04-18 ENCOUNTER — Encounter: Payer: Self-pay | Admitting: Otolaryngology

## 2023-05-16 ENCOUNTER — Ambulatory Visit: Payer: Medicare Other | Attending: Cardiology | Admitting: Cardiology

## 2023-05-16 ENCOUNTER — Encounter: Payer: Self-pay | Admitting: Cardiology

## 2023-05-16 VITALS — BP 116/62 | HR 75 | Resp 96 | Ht 63.0 in | Wt 168.0 lb

## 2023-05-16 DIAGNOSIS — E782 Mixed hyperlipidemia: Secondary | ICD-10-CM | POA: Diagnosis not present

## 2023-05-16 DIAGNOSIS — I2510 Atherosclerotic heart disease of native coronary artery without angina pectoris: Secondary | ICD-10-CM | POA: Diagnosis not present

## 2023-05-16 MED ORDER — REPATHA 140 MG/ML ~~LOC~~ SOSY: 1.0000 "pen " | PREFILLED_SYRINGE | SUBCUTANEOUS | 6 refills | Status: DC

## 2023-05-16 MED ORDER — PRAVASTATIN SODIUM 80 MG PO TABS: 80.0000 mg | ORAL_TABLET | Freq: Every day | ORAL | 1 refills | Status: DC

## 2023-05-16 NOTE — Patient Instructions (Signed)
Medication Instructions:   Your physician recommends that you continue on your current medications as directed. Please refer to the Current Medication list given to you today.  *If you need a refill on your cardiac medications before your next appointment, please call your pharmacy*   Lab Work:  None Ordered  If you have labs (blood work) drawn today and your tests are completely normal, you will receive your results only by: MyChart Message (if you have MyChart) OR A paper copy in the mail If you have any lab test that is abnormal or we need to change your treatment, we will call you to review the results.   Testing/Procedures:  None Ordered    Follow-Up: At [facility], you and your health needs are our priority.  As part of our continuing mission to provide you with exceptional heart care, we have created designated Provider Care Teams.  These Care Teams include your primary Cardiologist (physician) and Advanced Practice Providers (APPs -  Physician Assistants and Nurse Practitioners) who all work together to provide you with the care you need, when you need it.  We recommend signing up for the patient portal called "MyChart".  Sign up information is provided on this After Visit Summary.  MyChart is used to connect with patients for Virtual Visits (Telemedicine).  Patients are able to view lab/test results, encounter notes, upcoming appointments, etc.  Non-urgent messages can be sent to your provider as well.   To learn more about what you can do with MyChart, go to [email or web address].    Your next appointment:   12 month(s)  Provider:   You may see Brian Agbor-Etang, MD or one of the following Advanced Practice Providers on your designated Care Team:   Christopher Berge, NP Ryan Dunn, PA-C Cadence Furth, PA-C Sheri Hammock, NP  

## 2023-05-16 NOTE — Progress Notes (Signed)
Cardiology Office Note:    Date:  05/16/2023   ID:  Megan Nichols, DOB 01-21-1956, MRN [id number]  PCP:  Kandyce Rud, MD   [facility]  Cardiologist:  Debbe Odea, MD  Advanced Practice Provider:  No care team member to display Electrophysiologist:  None       Referring MD: Kandyce Rud, MD   Chief Complaint  Patient presents with   Follow-up    Patient denies new or acute cardiac problems/concerns today.  Pravastatin refill pended.      History of Present Illness:    Megan Nichols is a 67 y.o. female with a hx of anxiety, mild nonobstructive CAD, hyperlipidemia, smoker x30 years, who presents for follow-up.    Denies chest pain or shortness of breath, compliant with Repatha and Pravachol as prescribed, cholesterol is well-controlled.  She feels well, has no concerns at this time.  Prior notes Echocardiogram 04/05/2021, normal systolic and diastolic function, EF 55 to 60% Coronary CTA 04/05/2021, calcium score 192, mild nonobstructive CAD proximal LAD stenosis. Previous statin intolerance, currently tolerating Pravachol  Past Medical History:  Diagnosis Date   Allergy    Arthritis    Degeneration of intervertebral disc of lumbar region    Difficult intubation    Difficult intubation    Family history of adverse reaction to anesthesia    sister has PONV   Hyperlipidemia    Impaired fasting glucose 05/21/2016   Irritable bowel syndrome with constipation    Obesity, Class I, BMI 30.0-34.9 (see actual BMI)    Pelvic floor dysfunction    Tobacco use 12/26/2015    Past Surgical History:  Procedure Laterality Date   APPENDECTOMY     BLADDER SURGERY     BREAST CYST ASPIRATION Left    1999   ENDOSCOPIC CONCHA BULLOSA RESECTION Right 04/17/2023   Procedure: ENDOSCOPIC CONCHA BULLOSA RESECTION;  Surgeon: Vernie Murders, MD;  Location: [facility];  Service: ENT;  Laterality: Right;   HEMORRHOID SURGERY     NASAL  TURBINATE REDUCTION Right 04/17/2023   Procedure: INFERIOR TURBINATE REDUCTION;  Surgeon: Vernie Murders, MD;  Location: [facility];  Service: ENT;  Laterality: Right;   RECTOCELE REPAIR N/A 10/09/2018   Procedure: POSTERIOR REPAIR (RECTOCELE);  Surgeon: Schermerhorn, Ihor Austin, MD;  Location: [facility];  Service: Gynecology;  Laterality: N/A;   SEPTOPLASTY N/A 04/17/2023   Procedure: SEPTOPLASTY;  Surgeon: Vernie Murders, MD;  Location: [facility];  Service: ENT;  Laterality: N/A;   TONSILLECTOMY      Current Medications: Current Meds  Medication Sig   CALCIUM-VITAMIN D PO Take by mouth. 2000 mg of vitamin D   Multiple Vitamins-Minerals (PRESERVISION AREDS 2 PO) Take by mouth.   tiZANidine (ZANAFLEX) 2 MG tablet Take 2 mg by mouth at bedtime.   [DISCONTINUED] Evolocumab (REPATHA) 140 MG/ML SOSY Inject 1 pen  into the skin every 14 (fourteen) days.   [DISCONTINUED] pravastatin (PRAVACHOL) 80 MG tablet Take 1 tablet (80 mg total) by mouth at bedtime.     Allergies:   Aspirin   Social History   Socioeconomic History   Marital status: Single    Spouse name: Not on file   Number of children: Not on file   Years of education: Not on file   Highest education level: Not on file  Occupational History   Not on file  Tobacco Use   Smoking status: Every Day    Packs/day: 1.00    Years: 30.00  Additional pack years: 0.00    Total pack years: 30.00    Types: Cigarettes   Smokeless tobacco: Never  Vaping Use   Vaping Use: Never used  Substance and Sexual Activity   Alcohol use: Never    Alcohol/week: 0.0 standard drinks of alcohol   Drug use: No   Sexual activity: Not Currently  Other Topics Concern   Not on file  Social History Narrative   Not on file   Social Determinants of Health   Financial Resource Strain: Not on file  Food Insecurity: Not on file  Transportation Needs: Not on file  Physical Activity: Not on file  Stress: Not on file  Social  Connections: Not on file     Family History: The patient's family history includes AAA (abdominal aortic aneurysm) in her mother; Cancer in her father and maternal grandfather; Diabetes in her father and paternal grandmother; Heart attack in her paternal grandfather; Heart disease in her father and maternal grandmother; Hypertension in her father, sister, sister, and sister; Hypothyroidism in her father, sister, sister, and sister; Intracerebral hemorrhage in her mother; Leukemia in her father; Stroke in her maternal grandmother and mother. There is no history of Breast cancer.  ROS:   Please see the history of present illness.     All other systems reviewed and are negative.  EKGs/Labs/Other Studies Reviewed:    The following studies were reviewed today:   EKG:  EKG is ordered today.  EKG shows normal sinus rhythm.  Recent Labs: 03/06/2023: Hemoglobin 13.4; Platelets 312 04/13/2023: ALT 18; BUN 21; Creatinine, Ser 0.56; Potassium 3.8; Sodium 137  Recent Lipid Panel    Component Value Date/Time   CHOL 118 04/18/2022 0833   CHOL 141 11/27/2021 0811   TRIG 66 04/18/2022 0833   HDL 72 04/18/2022 0833   HDL 83 11/27/2021 0811   CHOLHDL 1.6 04/18/2022 0833   VLDL 13 04/18/2022 0833   LDLCALC 33 04/18/2022 0833   LDLCALC 31 11/27/2021 0811     Risk Assessment/Calculations:      Physical Exam:    VS:  BP 116/62 (BP Location: Left Arm, Patient Position: Sitting, Cuff Size: Normal)   Pulse 75   Resp (!) 96   Ht 5\' 3"  (1.6 m)   Wt 168 lb (76.2 kg)   BMI 29.76 kg/m     Wt Readings from Last 3 Encounters:  05/16/23 168 lb (76.2 kg)  04/17/23 164 lb (74.4 kg)  09/20/22 194 lb (88 kg)     GEN:  Well nourished, well developed in no acute distress HEENT: Normal NECK: No JVD; No carotid bruits CARDIAC: RRR, no murmurs, rubs, gallops RESPIRATORY:  Clear to auscultation without rales, wheezing or rhonchi  ABDOMEN: Soft, non-tender, non-distended MUSCULOSKELETAL:  No edema; No  deformity  SKIN: Warm and dry NEUROLOGIC:  Alert and oriented x 3 PSYCHIATRIC:  Normal affect   ASSESSMENT:    1. Coronary artery disease involving native coronary artery of native heart without angina pectoris   2. Mixed hyperlipidemia    PLAN:    In order of problems listed above:  CAD, mild nonobstructive disease in the proximal LAD.  Denies chest pain. Continue Repatha.  Pravachol.  Not tolerant to aspirin, Plavix causing bruising.  Hyperlipidemia, cholesterol control, LDL at goal.  Continue Repatha, Pravachol.  Follow-up yearly   Medication Adjustments/Labs and Tests Ordered: Current medicines are reviewed at length with the patient today.  Concerns regarding medicines are outlined above.  Orders Placed This Encounter  Procedures  EKG 12-Lead     Meds ordered this encounter  Medications   pravastatin (PRAVACHOL) 80 MG tablet    Sig: Take 1 tablet (80 mg total) by mouth at bedtime.    Dispense:  90 tablet    Refill:  1   Evolocumab (REPATHA) 140 MG/ML SOSY    Sig: Inject 1 pen  into the skin every 14 (fourteen) days.    Dispense:  2 mL    Refill:  6      Patient Instructions  Medication Instructions:   Your physician recommends that you continue on your current medications as directed. Please refer to the Current Medication list given to you today.  *If you need a refill on your cardiac medications before your next appointment, please call your pharmacy*   Lab Work:  None Ordered  If you have labs (blood work) drawn today and your tests are completely normal, you will receive your results only by: MyChart Message (if you have MyChart) OR A paper copy in the mail If you have any lab test that is abnormal or we need to change your treatment, we will call you to review the results.   Testing/Procedures:  None Ordered   Follow-Up: At [facility], you and your health needs are our priority.  As part of our continuing mission to provide you  with exceptional heart care, we have created designated Provider Care Teams.  These Care Teams include your primary Cardiologist (physician) and Advanced Practice Providers (APPs -  Physician Assistants and Nurse Practitioners) who all work together to provide you with the care you need, when you need it.  We recommend signing up for the patient portal called "MyChart".  Sign up information is provided on this After Visit Summary.  MyChart is used to connect with patients for Virtual Visits (Telemedicine).  Patients are able to view lab/test results, encounter notes, upcoming appointments, etc.  Non-urgent messages can be sent to your provider as well.   To learn more about what you can do with MyChart, go to [email or web address].    Your next appointment:   12 month(s)  Provider:   You may see Debbe Odea, MD or one of the following Advanced Practice Providers on your designated Care Team:   Nicolasa Ducking, NP Eula Listen, PA-C Cadence Fransico Michael, PA-C Charlsie Quest, NP   Signed, Debbe Odea, MD  05/16/2023 12:09 PM    [facility]

## 2023-08-14 ENCOUNTER — Encounter: Payer: Self-pay | Admitting: *Deleted

## 2023-08-15 ENCOUNTER — Encounter: Payer: Self-pay | Admitting: *Deleted

## 2023-08-21 ENCOUNTER — Ambulatory Visit: Payer: Medicare Other | Admitting: Podiatry

## 2023-08-21 DIAGNOSIS — L600 Ingrowing nail: Secondary | ICD-10-CM | POA: Diagnosis not present

## 2023-08-21 NOTE — Progress Notes (Signed)
Subjective:  Patient ID: Megan Nichols, female    DOB: 22-Sep-1956,  MRN: [id number]  Chief Complaint  Patient presents with   Nail Problem    Pt stated that she has some discomfort     67 y.o. female presents with the above complaint.  Patient presents with complaint left hallux lateral border ingrown painful to touch is progressive gotten worse worse with ambulation or shoe pressure she would like for me to debride and remove it.  She denies seeing anyone else prior to seeing me denies any other acute complaints.   Review of Systems: Negative except as noted in the HPI. Denies N/V/F/Ch.  Past Medical History:  Diagnosis Date   Allergy    Aortic atherosclerosis (HCC)    Arthritis    Cervical disc disease    Cervicalgia    Chronic sinusitis    Coronary artery disease    Degeneration of intervertebral disc of lumbar region    Depression    Difficult intubation    Difficult intubation    Family history of adverse reaction to anesthesia    sister has PONV   Hyperlipidemia    Impaired fasting glucose 05/21/2016   Irritable bowel syndrome with constipation    Obesity, Class I, BMI 30.0-34.9 (see actual BMI)    Pelvic floor dysfunction    Tobacco use 12/26/2015    Current Outpatient Medications:    albuterol (VENTOLIN HFA) 108 (90 Base) MCG/ACT inhaler, Inhale 2 puffs into the lungs every 4 (four) hours as needed for wheezing or shortness of breath., Disp: , Rfl:    budesonide-formoterol (SYMBICORT) 80-4.5 MCG/ACT inhaler, Inhale 2 puffs into the lungs 2 (two) times daily., Disp: , Rfl:    CALCIUM-VITAMIN D PO, Take by mouth. 2000 mg of vitamin D, Disp: , Rfl:    cephALEXin (KEFLEX) 500 MG capsule, Take 1 capsule (500 mg total) by mouth 2 (two) times daily. (Patient not taking: Reported on 05/16/2023), Disp: 14 capsule, Rfl: 0   Evolocumab (REPATHA) 140 MG/ML SOSY, Inject 1 pen  into the skin every 14 (fourteen) days., Disp: 2 mL, Rfl: 6   Multiple Vitamins-Minerals  (PRESERVISION AREDS 2 PO), Take by mouth., Disp: , Rfl:    pravastatin (PRAVACHOL) 80 MG tablet, Take 1 tablet (80 mg total) by mouth at bedtime., Disp: 90 tablet, Rfl: 1   predniSONE (DELTASONE) 10 MG tablet, Start with 3 pills tomorrow. Taper over the next 6 days.  3,3,2,2,1,1. (Patient not taking: Reported on 05/16/2023), Disp: 12 tablet, Rfl: 0   Spacer/Aero-Holding Chambers (AEROCHAMBER HOLDING CHAMBER) DEVI, by Does not apply route as directed., Disp: , Rfl:    tiZANidine (ZANAFLEX) 2 MG tablet, Take 2 mg by mouth at bedtime., Disp: , Rfl:   Social History   Tobacco Use  Smoking Status Every Day   Current packs/day: 1.00   Average packs/day: 1 pack/day for 30.0 years (30.0 ttl pk-yrs)   Types: Cigarettes  Smokeless Tobacco Never    Allergies  Allergen Reactions   Aspirin     Foaming at the mouth Other reaction(s): Other (See Comments) Foaming at the mouth   Objective:  There were no vitals filed for this visit. There is no height or weight on file to calculate BMI. Constitutional Well developed. Well nourished.  Vascular Dorsalis pedis pulses palpable bilaterally. Posterior tibial pulses palpable bilaterally. Capillary refill normal to all digits.  No cyanosis or clubbing noted. Pedal hair growth normal.  Neurologic Normal speech. Oriented to person, place, and time. Epicritic sensation  to light touch grossly present bilaterally.  Dermatologic Painful ingrowing nail at lateral nail borders of the hallux nail left. No other open wounds. No skin lesions.  Orthopedic: Normal joint ROM without pain or crepitus bilaterally. No visible deformities. No bony tenderness.   Radiographs: None Assessment:   1. Ingrown left big toenail    Plan:  Patient was evaluated and treated and all questions answered.  Ingrown Nail, left -Patient elects to proceed with minor surgery to remove ingrown toenail removal today. Consent reviewed and signed by patient. -Ingrown nail  excised. See procedure note. -Educated on post-procedure care including soaking. Written instructions provided and reviewed. -Patient to follow up in 2 weeks for nail check.  Procedure: Excision of Ingrown Toenail Location: Left 1st toe lateral nail borders. Anesthesia: Lidocaine 1% plain; 1.5 mL and Marcaine 0.5% plain; 1.5 mL, digital block. Skin Prep: Betadine. Dressing: Silvadene; telfa; dry, sterile, compression dressing. Technique: Following skin prep, the toe was exsanguinated and a tourniquet was secured at the base of the toe. The affected nail border was freed, split with a nail splitter, and excised. Chemical matrixectomy was then performed with phenol and irrigated out with alcohol. The tourniquet was then removed and sterile dressing applied. Disposition: Patient tolerated procedure well. Patient to return in 2 weeks for follow-up.   No follow-ups on file.

## 2023-08-22 ENCOUNTER — Ambulatory Visit: Payer: Medicare Other | Admitting: Anesthesiology

## 2023-08-22 ENCOUNTER — Encounter
Admission: RE | Disposition: A | Discharge status: Home / Self Care | Payer: Self-pay | Source: Ambulatory Visit | Attending: Gastroenterology

## 2023-08-22 ENCOUNTER — Ambulatory Visit
Admission: RE | Admit: 2023-08-22 | Discharge: 2023-08-22 | Disposition: A | Discharge status: Home / Self Care | Payer: Medicare Other | Source: Ambulatory Visit | Attending: Gastroenterology | Admitting: Gastroenterology

## 2023-08-22 ENCOUNTER — Encounter: Payer: Self-pay | Admitting: *Deleted

## 2023-08-22 DIAGNOSIS — D125 Benign neoplasm of sigmoid colon: Secondary | ICD-10-CM | POA: Insufficient documentation

## 2023-08-22 DIAGNOSIS — K6389 Other specified diseases of intestine: Secondary | ICD-10-CM | POA: Insufficient documentation

## 2023-08-22 DIAGNOSIS — Z1211 Encounter for screening for malignant neoplasm of colon: Secondary | ICD-10-CM | POA: Diagnosis present

## 2023-08-22 DIAGNOSIS — Z6834 Body mass index (BMI) 34.0-34.9, adult: Secondary | ICD-10-CM | POA: Diagnosis not present

## 2023-08-22 DIAGNOSIS — K5730 Diverticulosis of large intestine without perforation or abscess without bleeding: Secondary | ICD-10-CM | POA: Diagnosis not present

## 2023-08-22 DIAGNOSIS — K589 Irritable bowel syndrome without diarrhea: Secondary | ICD-10-CM | POA: Diagnosis not present

## 2023-08-22 DIAGNOSIS — E669 Obesity, unspecified: Secondary | ICD-10-CM | POA: Diagnosis not present

## 2023-08-22 DIAGNOSIS — I2510 Atherosclerotic heart disease of native coronary artery without angina pectoris: Secondary | ICD-10-CM | POA: Insufficient documentation

## 2023-08-22 DIAGNOSIS — K640 First degree hemorrhoids: Secondary | ICD-10-CM | POA: Diagnosis not present

## 2023-08-22 DIAGNOSIS — I700 Atherosclerosis of aorta: Secondary | ICD-10-CM | POA: Insufficient documentation

## 2023-08-22 HISTORY — PX: COLONOSCOPY WITH PROPOFOL: SHX5780

## 2023-08-22 HISTORY — DX: Atherosclerosis of aorta: I70.0

## 2023-08-22 HISTORY — DX: Cervicalgia: M54.2

## 2023-08-22 HISTORY — DX: Cervical disc disorder, unspecified, unspecified cervical region: M50.90

## 2023-08-22 HISTORY — DX: Atherosclerotic heart disease of native coronary artery without angina pectoris: I25.10

## 2023-08-22 HISTORY — DX: Chronic sinusitis, unspecified: J32.9

## 2023-08-22 HISTORY — PX: POLYPECTOMY: SHX5525

## 2023-08-22 SURGERY — COLONOSCOPY WITH PROPOFOL
Anesthesia: General

## 2023-08-22 MED ORDER — LIDOCAINE HCL (PF) 2 % IJ SOLN
INTRAMUSCULAR | Status: AC
  Filled 2023-08-22: qty 5

## 2023-08-22 MED ORDER — LIDOCAINE HCL (CARDIAC) PF 100 MG/5ML IV SOSY
PREFILLED_SYRINGE | INTRAVENOUS | Status: DC | PRN
  Administered 2023-08-22: 20 mg via INTRAVENOUS

## 2023-08-22 MED ORDER — STERILE WATER FOR IRRIGATION IR SOLN
Status: DC | PRN
  Administered 2023-08-22: 60 mL

## 2023-08-22 MED ORDER — PROPOFOL 500 MG/50ML IV EMUL
INTRAVENOUS | Status: DC | PRN
  Administered 2023-08-22: 125 ug/kg/min via INTRAVENOUS

## 2023-08-22 MED ORDER — SODIUM CHLORIDE 0.9 % IV SOLN
INTRAVENOUS | Status: DC
  Administered 2023-08-22: 1000 mL via INTRAVENOUS

## 2023-08-22 MED ORDER — PROPOFOL 1000 MG/100ML IV EMUL
INTRAVENOUS | Status: AC
  Filled 2023-08-22: qty 100

## 2023-08-22 MED ORDER — PROPOFOL 10 MG/ML IV BOLUS
INTRAVENOUS | Status: DC | PRN
  Administered 2023-08-22: 100 mg via INTRAVENOUS

## 2023-08-22 NOTE — Anesthesia Postprocedure Evaluation (Signed)
Anesthesia Post Note  Patient: Megan Nichols  Procedure(s) Performed: COLONOSCOPY WITH PROPOFOL POLYPECTOMY  Patient location during evaluation: Endoscopy Anesthesia Type: General Level of consciousness: awake and alert Pain management: pain level controlled Vital Signs Assessment: post-procedure vital signs reviewed and stable Respiratory status: spontaneous breathing, nonlabored ventilation, respiratory function stable and patient connected to nasal cannula oxygen Cardiovascular status: blood pressure returned to baseline and stable Postop Assessment: no apparent nausea or vomiting Anesthetic complications: no   No notable events documented.   Last Vitals:  Vitals:   08/22/23 0845 08/22/23 0855  BP: 119/69 126/67  Pulse: 65 (!) 53  Resp: 17 (!) 21  Temp:    SpO2: 98% 100%    Last Pain:  Vitals:   08/22/23 0855  TempSrc:   PainSc: 0-No pain                 Louie Boston

## 2023-08-22 NOTE — Transfer of Care (Signed)
Immediate Anesthesia Transfer of Care Note  Patient: Megan Nichols  Procedure(s) Performed: COLONOSCOPY WITH PROPOFOL  Patient Location: PACU  Anesthesia Type:General  Level of Consciousness: drowsy  Airway & Oxygen Therapy: Patient Spontanous Breathing  Post-op Assessment: Report given to RN and Post -op Vital signs reviewed and stable  Post vital signs: Reviewed and stable  Last Vitals:  Vitals Value Taken Time  BP 111/74 08/22/23 0835  Temp 97.5   Pulse 57 08/22/23 0836  Resp 18 08/22/23 0836  SpO2 100 % 08/22/23 0836  Vitals shown include unfiled device data.  Last Pain:  Vitals:   08/22/23 0749  TempSrc: Temporal  PainSc: 0-No pain         Complications: No notable events documented.

## 2023-08-22 NOTE — Anesthesia Preprocedure Evaluation (Addendum)
Anesthesia Evaluation  Patient identified by MRN, date of birth, ID band Patient awake    Reviewed: Allergy & Precautions, NPO status , Patient's Chart, lab work & pertinent test results  History of Anesthesia Complications (+) DIFFICULT AIRWAY and history of anesthetic complications (Per last intubation note "Difficulty Due To: Difficulty was anticipated and Difficult Airway- due to anterior larynx Attempt with mac 3 grade 3 view. Magrath 3 blade one attempt successful")  Airway Mallampati: III  TM Distance: >3 FB Neck ROM: full    Dental no notable dental hx. (+) Partial Upper   Pulmonary Current Smoker   Pulmonary exam normal        Cardiovascular + CAD  Normal cardiovascular exam     Neuro/Psych  PSYCHIATRIC DISORDERS Anxiety Depression     Neuromuscular disease    GI/Hepatic negative GI ROS, Neg liver ROS,,,  Endo/Other  negative endocrine ROS    Renal/GU negative Renal ROS  negative genitourinary   Musculoskeletal   Abdominal   Peds  Hematology negative hematology ROS (+)   Anesthesia Other Findings Past Medical History: No date: Allergy No date: Aortic atherosclerosis (HCC) No date: Arthritis No date: Cervical disc disease No date: Cervicalgia No date: Chronic sinusitis No date: Coronary artery disease No date: Degeneration of intervertebral disc of lumbar region No date: Depression No date: Difficult intubation No date: Difficult intubation No date: Family history of adverse reaction to anesthesia     Comment:  sister has PONV No date: Hyperlipidemia 05/21/2016: Impaired fasting glucose No date: Irritable bowel syndrome with constipation No date: Obesity, Class I, BMI 30.0-34.9 (see actual BMI) No date: Pelvic floor dysfunction 12/26/2015: Tobacco use  Past Surgical History: No date: APPENDECTOMY No date: BLADDER SURGERY No date: BREAST CYST ASPIRATION; Left     Comment:  1999 No date:  Chronic venous insufficiency 04/17/2023: ENDOSCOPIC CONCHA BULLOSA RESECTION; Right     Comment:  Procedure: ENDOSCOPIC CONCHA BULLOSA RESECTION;                Surgeon: Vernie Murders, MD;  Location: [facility]               CNTR;  Service: ENT;  Laterality: Right; No date: HEMORRHOID SURGERY 04/17/2023: NASAL TURBINATE REDUCTION; Right     Comment:  Procedure: INFERIOR TURBINATE REDUCTION;  Surgeon:               Vernie Murders, MD;  Location: [facility];                Service: ENT;  Laterality: Right; 10/09/2018: RECTOCELE REPAIR; N/A     Comment:  Procedure: POSTERIOR REPAIR (RECTOCELE);  Surgeon:               Schermerhorn, Ihor Austin, MD;  Location: [facility] ORS;                Service: Gynecology;  Laterality: N/A; 04/17/2023: SEPTOPLASTY; N/A     Comment:  Procedure: SEPTOPLASTY;  Surgeon: Vernie Murders, MD;                Location: [facility] SURGERY CNTR;  Service: ENT;                Laterality: N/A; No date: TONSILLECTOMY No date: TONSILLECTOMY No date: TUBAL LIGATION No date: Varicose veins; Bilateral     Reproductive/Obstetrics negative OB ROS  Anesthesia Physical Anesthesia Plan  ASA: 2  Anesthesia Plan: General   Post-op Pain Management: Minimal or no pain anticipated   Induction: Intravenous  PONV Risk Score and Plan: 1 and Propofol infusion, TIVA and Treatment may vary due to age or medical condition  Airway Management Planned: Natural Airway and Nasal Cannula  Additional Equipment:   Intra-op Plan:   Post-operative Plan:   Informed Consent: I have reviewed the patients History and Physical, chart, labs and discussed the procedure including the risks, benefits and alternatives for the proposed anesthesia with the patient or authorized representative who has indicated his/her understanding and acceptance.     Dental Advisory Given  Plan Discussed with: Anesthesiologist, CRNA and  Surgeon  Anesthesia Plan Comments: (Patient consented for risks of anesthesia including but not limited to:  - adverse reactions to medications - risk of airway placement if required - damage to eyes, teeth, lips or other oral mucosa - nerve damage due to positioning  - sore throat or hoarseness - Damage to heart, brain, nerves, lungs, other parts of body or loss of life  Patient voiced understanding.)       Anesthesia Quick Evaluation

## 2023-08-22 NOTE — Op Note (Signed)
[facility]astroenter[facility] Patient Name: Megan Nichols Procedure Date: 08/22/2023 6:52 AM MRN: [id number] Account #: [id number] Date of Birth: Mar 12, 1956 Admit Type: Outpatient Age: 67 Room: [facility] ENDO ROOM 3 Gender: Female Note Status: Finalized Instrument Name: Prentice Docker [id number] Procedure:             Colonoscopy Indications:           Screening for colorectal malignant neoplasm Providers:             Eather Colas MD, MD Referring MD:          Hassell Halim MD (Referring MD) Medicines:             Monitored Anesthesia Care Complications:         No immediate complications. Estimated blood loss:                         Minimal. Procedure:             Pre-Anesthesia Assessment:                        - Prior to the procedure, a History and Physical was                         performed, and patient medications and allergies were                         reviewed. The patient is competent. The risks and                         benefits of the procedure and the sedation options and                         risks were discussed with the patient. All questions                         were answered and informed consent was obtained.                         Patient identification and proposed procedure were                         verified by the physician, the nurse, the                         anesthesiologist, the anesthetist and the technician                         in the endoscopy suite. Mental Status Examination:                         alert and oriented. Airway Examination: normal                         oropharyngeal airway and neck mobility. Respiratory                         Examination: clear to auscultation. CV Examination:  normal. Prophylactic Antibiotics: The patient does not                         require prophylactic antibiotics. Prior                         Anticoagulants: The patient has taken no anticoagulant                          or antiplatelet agents. ASA Grade Assessment: II - A                         patient with mild systemic disease. After reviewing                         the risks and benefits, the patient was deemed in                         satisfactory condition to undergo the procedure. The                         anesthesia plan was to use monitored anesthesia care                         (MAC). Immediately prior to administration of                         medications, the patient was re-assessed for adequacy                         to receive sedatives. The heart rate, respiratory                         rate, oxygen saturations, blood pressure, adequacy of                         pulmonary ventilation, and response to care were                         monitored throughout the procedure. The physical                         status of the patient was re-assessed after the                         procedure.                        After obtaining informed consent, the colonoscope was                         passed under direct vision. Throughout the procedure,                         the patient's blood pressure, pulse, and oxygen                         saturations were monitored continuously. The  Colonoscope was introduced through the anus and                         advanced to the the cecum, identified by appendiceal                         orifice and ileocecal valve. The colonoscopy was                         performed without difficulty. The patient tolerated                         the procedure well. The quality of the bowel                         preparation was good. The ileocecal valve, appendiceal                         orifice, and rectum were photographed. Findings:      The perianal and digital rectal examinations were normal.      A 2 mm polyp was found in the sigmoid colon. The polyp was sessile. The       polyp was removed with a  jumbo cold forceps. Resection and retrieval       were complete. Estimated blood loss was minimal.      A few small-mouthed diverticula were found in the sigmoid colon.      Internal hemorrhoids were found during retroflexion. The hemorrhoids       were Grade I (internal hemorrhoids that do not prolapse).      The exam was otherwise without abnormality on direct and retroflexion       views. Impression:            - One 2 mm polyp in the sigmoid colon, removed with a                         jumbo cold forceps. Resected and retrieved.                        - Diverticulosis in the sigmoid colon.                        - Internal hemorrhoids.                        - The examination was otherwise normal on direct and                         retroflexion views. Recommendation:        - Discharge patient to home.                        - Resume previous diet.                        - Continue present medications.                        - Await pathology results.                        -  Repeat colonoscopy in 7-10 years for surveillance                         based on pathology results.                        - Return to referring physician as previously                         scheduled. Procedure Code(s):     --- Professional ---                        [phone], Colonoscopy, flexible; with biopsy, single or                         multiple Diagnosis Code(s):     --- Professional ---                        Z12.11, Encounter for screening for malignant neoplasm                         of colon                        D12.5, Benign neoplasm of sigmoid colon                        K64.0, First degree hemorrhoids                        K57.30, Diverticulosis of large intestine without                         perforation or abscess without bleeding CPT copyright 2022 American [employer]. All rights reserved. The codes documented in this report are preliminary and upon coder review may   be revised to meet current compliance requirements. Eather Colas MD, MD 08/22/2023 8:36:31 AM Number of Addenda: 0 Note Initiated On: 08/22/2023 6:52 AM Scope Withdrawal Time: 0 hours 8 minutes 39 seconds  Total Procedure Duration: 0 hours 15 minutes 12 seconds  Estimated Blood Loss:  Estimated blood loss was minimal.      [facility]

## 2023-08-22 NOTE — H&P (Signed)
Outpatient short stay form Pre-procedure 08/22/2023  Regis Bill, MD  Primary Physician: Kandyce Rud, MD  Reason for visit:  Screening colonoscopy  History of present illness:    67 y/o lady with history of hld, IBS, and arthritis here for colonoscopy for screening. Had normal colonoscopy in 2013. No blood thinners. No family history of GI malignancies. No significant abdominal surgeries.    Current Facility-Administered Medications:    0.9 %  sodium chloride infusion, , Intravenous, Continuous, Sanvika Cuttino, Rossie Muskrat, MD, Last Rate: 20 mL/hr at 08/22/23 0755, 1,000 mL at 08/22/23 0755  Medications Prior to Admission  Medication Sig Dispense Refill Last Dose   albuterol (VENTOLIN HFA) 108 (90 Base) MCG/ACT inhaler Inhale 2 puffs into the lungs every 4 (four) hours as needed for wheezing or shortness of breath.    at prn   budesonide-formoterol (SYMBICORT) 80-4.5 MCG/ACT inhaler Inhale 2 puffs into the lungs 2 (two) times daily.      Evolocumab (REPATHA) 140 MG/ML SOSY Inject 1 pen  into the skin every 14 (fourteen) days. 2 mL 6 Past Month   Multiple Vitamins-Minerals (PRESERVISION AREDS 2 PO) Take by mouth.   Past Week   pravastatin (PRAVACHOL) 80 MG tablet Take 1 tablet (80 mg total) by mouth at bedtime. 90 tablet 1 Past Week   Spacer/Aero-Holding Chambers (AEROCHAMBER HOLDING CHAMBER) DEVI by Does not apply route as directed.   08/21/2023   tiZANidine (ZANAFLEX) 2 MG tablet Take 2 mg by mouth at bedtime.   Past Week   CALCIUM-VITAMIN D PO Take by mouth. 2000 mg of vitamin D      cephALEXin (KEFLEX) 500 MG capsule Take 1 capsule (500 mg total) by mouth 2 (two) times daily. (Patient not taking: Reported on 05/16/2023) 14 capsule 0    predniSONE (DELTASONE) 10 MG tablet Start with 3 pills tomorrow. Taper over the next 6 days.  3,3,2,2,1,1. (Patient not taking: Reported on 05/16/2023) 12 tablet 0      Allergies  Allergen Reactions   Aspirin     Foaming at the mouth Other  reaction(s): Other (See Comments) Foaming at the mouth     Past Medical History:  Diagnosis Date   Allergy    Aortic atherosclerosis (HCC)    Arthritis    Cervical disc disease    Cervicalgia    Chronic sinusitis    Coronary artery disease    Degeneration of intervertebral disc of lumbar region    Depression    Difficult intubation    Difficult intubation    Family history of adverse reaction to anesthesia    sister has PONV   Hyperlipidemia    Impaired fasting glucose 05/21/2016   Irritable bowel syndrome with constipation    Obesity, Class I, BMI 30.0-34.9 (see actual BMI)    Pelvic floor dysfunction    Tobacco use 12/26/2015    Review of systems:  Otherwise negative.    Physical Exam  Gen: Alert, oriented. Appears stated age.  HEENT: PERRLA. Lungs: No respiratory distress CV: RRR Abd: soft, benign, no masses Ext: No edema    Planned procedures: Proceed with colonoscopy. The patient understands the nature of the planned procedure, indications, risks, alternatives and potential complications including but not limited to bleeding, infection, perforation, damage to internal organs and possible oversedation/side effects from anesthesia. The patient agrees and gives consent to proceed.  Please refer to procedure notes for findings, recommendations and patient disposition/instructions.     Regis Bill, MD [facility]

## 2023-08-22 NOTE — Interval H&P Note (Signed)
History and Physical Interval Note:  08/22/2023 8:06 AM  Megan Nichols  has presented today for surgery, with the diagnosis of Z12.11 (ICD-10-CM) - Colon cancer screening.  The various methods of treatment have been discussed with the patient and family. After consideration of risks, benefits and other options for treatment, the patient has consented to  Procedure(s): COLONOSCOPY WITH PROPOFOL (N/A) as a surgical intervention.  The patient's history has been reviewed, patient examined, no change in status, stable for surgery.  I have reviewed the patient's chart and labs.  Questions were answered to the patient's satisfaction.     Regis Bill  Ok to proceed with colonoscopy

## 2023-08-25 ENCOUNTER — Encounter: Payer: Self-pay | Admitting: Gastroenterology

## 2023-09-24 ENCOUNTER — Other Ambulatory Visit (HOSPITAL_COMMUNITY): Payer: Self-pay

## 2023-09-24 ENCOUNTER — Telehealth: Payer: Self-pay | Admitting: Pharmacy Technician

## 2023-09-24 NOTE — Telephone Encounter (Signed)
Pharmacy Patient Advocate Encounter   Received notification from CoverMyMeds that prior authorization for repatha is required/requested.   Insurance verification completed.   The patient is insured through [facility] .   Per test claim: PA required; PA started via CoverMyMeds. KEY [id number] . Waiting for clinical questions to populate.

## 2023-09-25 ENCOUNTER — Other Ambulatory Visit (HOSPITAL_COMMUNITY): Payer: Self-pay

## 2023-09-25 NOTE — Telephone Encounter (Signed)
Pharmacy Patient Advocate Encounter  Received notification from [facility] that Prior Authorization for repatha has been APPROVED from 09/24/23 to 09/23/24   PA #/Case ID/Reference #: [id number]

## 2023-10-10 ENCOUNTER — Ambulatory Visit
Admission: RE | Admit: 2023-10-10 | Discharge: 2023-10-10 | Disposition: A | Discharge status: Home / Self Care | Payer: Medicare Other | Source: Ambulatory Visit | Attending: Acute Care | Admitting: Acute Care

## 2023-10-10 DIAGNOSIS — Z122 Encounter for screening for malignant neoplasm of respiratory organs: Secondary | ICD-10-CM | POA: Diagnosis present

## 2023-10-10 DIAGNOSIS — F17210 Nicotine dependence, cigarettes, uncomplicated: Secondary | ICD-10-CM

## 2023-10-10 DIAGNOSIS — Z87891 Personal history of nicotine dependence: Secondary | ICD-10-CM | POA: Insufficient documentation

## 2023-10-20 ENCOUNTER — Other Ambulatory Visit: Payer: Self-pay | Admitting: Family Medicine

## 2023-10-20 DIAGNOSIS — Z1231 Encounter for screening mammogram for malignant neoplasm of breast: Secondary | ICD-10-CM

## 2023-10-28 ENCOUNTER — Other Ambulatory Visit: Payer: Self-pay

## 2023-10-28 DIAGNOSIS — F17210 Nicotine dependence, cigarettes, uncomplicated: Secondary | ICD-10-CM

## 2023-10-28 DIAGNOSIS — Z122 Encounter for screening for malignant neoplasm of respiratory organs: Secondary | ICD-10-CM

## 2023-10-28 DIAGNOSIS — Z87891 Personal history of nicotine dependence: Secondary | ICD-10-CM

## 2023-11-10 ENCOUNTER — Other Ambulatory Visit: Payer: Self-pay

## 2023-11-10 MED ORDER — PRAVASTATIN SODIUM 80 MG PO TABS: 80.0000 mg | ORAL_TABLET | Freq: Every day | ORAL | 1 refills | Status: DC

## 2023-11-10 NOTE — Telephone Encounter (Signed)
last visit 05/16/23 with plan to f/u in  12 months.    next visit:  none/active recall

## 2023-11-10 NOTE — Telephone Encounter (Signed)
Requested Prescriptions   Signed Prescriptions Disp Refills   pravastatin (PRAVACHOL) 80 MG tablet 90 tablet 1    Sig: Take 1 tablet (80 mg total) by mouth at bedtime.    Authorizing Provider: Debbe Odea    Ordering User: Guerry Minors

## 2023-12-03 ENCOUNTER — Ambulatory Visit
Admission: RE | Admit: 2023-12-03 | Discharge: 2023-12-03 | Disposition: A | Discharge status: Home / Self Care | Payer: Medicare Other | Source: Ambulatory Visit | Attending: Family Medicine | Admitting: Family Medicine

## 2023-12-03 DIAGNOSIS — Z1231 Encounter for screening mammogram for malignant neoplasm of breast: Secondary | ICD-10-CM | POA: Diagnosis present

## 2023-12-11 ENCOUNTER — Ambulatory Visit: Payer: Medicare Other | Admitting: Dermatology

## 2023-12-11 DIAGNOSIS — R234 Changes in skin texture: Secondary | ICD-10-CM

## 2023-12-11 DIAGNOSIS — B079 Viral wart, unspecified: Secondary | ICD-10-CM

## 2023-12-11 DIAGNOSIS — D229 Melanocytic nevi, unspecified: Secondary | ICD-10-CM

## 2023-12-11 DIAGNOSIS — Z1283 Encounter for screening for malignant neoplasm of skin: Secondary | ICD-10-CM

## 2023-12-11 DIAGNOSIS — D1801 Hemangioma of skin and subcutaneous tissue: Secondary | ICD-10-CM

## 2023-12-11 DIAGNOSIS — L814 Other melanin hyperpigmentation: Secondary | ICD-10-CM

## 2023-12-11 DIAGNOSIS — L820 Inflamed seborrheic keratosis: Secondary | ICD-10-CM

## 2023-12-11 DIAGNOSIS — Z7189 Other specified counseling: Secondary | ICD-10-CM

## 2023-12-11 DIAGNOSIS — L821 Other seborrheic keratosis: Secondary | ICD-10-CM

## 2023-12-11 DIAGNOSIS — L578 Other skin changes due to chronic exposure to nonionizing radiation: Secondary | ICD-10-CM

## 2023-12-11 DIAGNOSIS — W908XXA Exposure to other nonionizing radiation, initial encounter: Secondary | ICD-10-CM

## 2023-12-11 DIAGNOSIS — Z872 Personal history of diseases of the skin and subcutaneous tissue: Secondary | ICD-10-CM

## 2023-12-11 NOTE — Patient Instructions (Addendum)
Can use amlactin rapid relief cream to use daily to help with peeling at bottom of feet.   Seborrheic Keratosis  What causes seborrheic keratoses? Seborrheic keratoses are harmless, common skin growths that first appear during adult life.  As time goes by, more growths appear.  Some people may develop a large number of them.  Seborrheic keratoses appear on both covered and uncovered body parts.  They are not caused by sunlight.  The tendency to develop seborrheic keratoses can be inherited.  They vary in color from skin-colored to gray, brown, or even black.  They can be either smooth or have a rough, warty surface.   Seborrheic keratoses are superficial and look as if they were stuck on the skin.  Under the microscope this type of keratosis looks like layers upon layers of skin.  That is why at times the top layer may seem to fall off, but the rest of the growth remains and re-grows.    Treatment Seborrheic keratoses do not need to be treated, but can easily be removed in the office.  Seborrheic keratoses often cause symptoms when they rub on clothing or jewelry.  Lesions can be in the way of shaving.  If they become inflamed, they can cause itching, soreness, or burning.  Removal of a seborrheic keratosis can be accomplished by freezing, burning, or surgery. If any spot bleeds, scabs, or grows rapidly, please return to have it checked, as these can be an indication of a skin cancer.   Cryotherapy Aftercare  Wash gently with soap and water everyday.   Apply Vaseline and Band-Aid daily until healed.   Melanoma ABCDEs  Melanoma is the most dangerous type of skin cancer, and is the leading cause of death from skin disease.  You are more likely to develop melanoma if you: Have light-colored skin, light-colored eyes, or red or blond hair Spend a lot of time in the sun Tan regularly, either outdoors or in a tanning bed Have had blistering sunburns, especially during childhood Have a close family  member who has had a melanoma Have atypical moles or large birthmarks  Early detection of melanoma is key since treatment is typically straightforward and cure rates are extremely high if we catch it early.   The first sign of melanoma is often a change in a mole or a new dark spot.  The ABCDE system is a way of remembering the signs of melanoma.  A for asymmetry:  The two halves do not match. B for border:  The edges of the growth are irregular. C for color:  A mixture of colors are present instead of an even brown color. D for diameter:  Melanomas are usually (but not always) greater than 6mm - the size of a pencil eraser. E for evolution:  The spot keeps changing in size, shape, and color.  Please check your skin once per month between visits. You can use a small mirror in front and a large mirror behind you to keep an eye on the back side or your body.   If you see any new or changing lesions before your next follow-up, please call to schedule a visit.  Please continue daily skin protection including broad spectrum sunscreen SPF 30+ to sun-exposed areas, reapplying every 2 hours as needed when you're outdoors.   Staying in the shade or wearing long sleeves, sun glasses (UVA+UVB protection) and wide brim hats (4-inch brim around the entire circumference of the hat) are also recommended for sun protection.  Due to recent changes in healthcare laws, you may see results of your pathology and/or laboratory studies on MyChart before the doctors have had a chance to review them. We understand that in some cases there may be results that are confusing or concerning to you. Please understand that not all results are received at the same time and often the doctors may need to interpret multiple results in order to provide you with the best plan of care or course of treatment. Therefore, we ask that you please give [place] 2 business days to thoroughly review all your results before contacting the office  for clarification. Should we see a critical lab result, you will be contacted sooner.   If You Need Anything After Your Visit  If you have any questions or concerns for your doctor, please call our main line at [phone] and press option 4 to reach your doctor's medical assistant. If no one answers, please leave a voicemail as directed and we will return your call as soon as possible. Messages left after 4 pm will be answered the following business day.   You may also send [place] a message via MyChart. We typically respond to MyChart messages within 1-2 business days.  For prescription refills, please ask your pharmacy to contact our office. Our fax number is [phone].  If you have an urgent issue when the clinic is closed that cannot wait until the next business day, you can page your doctor at the number below.    Please note that while we do our best to be available for urgent issues outside of office hours, we are not available 24/7.   If you have an urgent issue and are unable to reach [place], you may choose to seek medical care at your doctor's office, retail clinic, urgent care center, or emergency room.  If you have a medical emergency, please immediately call 911 or go to the emergency department.  Pager Numbers  - Dr. Gwen Pounds: [phone]  - Dr. Roseanne Reno: [phone]  - Dr. Katrinka Blazing: [phone]   In the event of inclement weather, please call our main line at [phone] for an update on the status of any delays or closures.  Dermatology Medication Tips: Please keep the boxes that topical medications come in in order to help keep track of the instructions about where and how to use these. Pharmacies typically print the medication instructions only on the boxes and not directly on the medication tubes.   If your medication is too expensive, please contact our office at [phone] option 4 or send [place] a message through MyChart.   We are unable to tell what your co-pay for  medications will be in advance as this is different depending on your insurance coverage. However, we may be able to find a substitute medication at lower cost or fill out paperwork to get insurance to cover a needed medication.   If a prior authorization is required to get your medication covered by your insurance company, please allow [place] 1-2 business days to complete this process.  Drug prices often vary depending on where the prescription is filled and some pharmacies may offer cheaper prices.  The website www.goodrx.com contains coupons for medications through different pharmacies. The prices here do not account for what the cost may be with help from insurance (it may be cheaper with your insurance), but the website can give you the price if you did not use any insurance.  - You can print the associated coupon and take  it with your prescription to the pharmacy.  - You may also stop by our office during regular business hours and pick up a GoodRx coupon card.  - If you need your prescription sent electronically to a different pharmacy, notify our office through [facility] or by phone at [phone] option 4.     Si Usted Necesita Algo Despus de Su Visita  Tambin puede enviarnos un mensaje a travs de Clinical cytogeneticist. Por lo general respondemos a los mensajes de MyChart en el transcurso de 1 a 2 das hbiles.  Para renovar recetas, por favor pida a su farmacia que se ponga en contacto con nuestra oficina. Annie Sable de fax es [employer] [phone].  Si tiene un asunto urgente cuando la clnica est cerrada y que no puede esperar hasta el siguiente da hbil, puede llamar/localizar a su doctor(a) al nmero que aparece a continuacin.   Por favor, tenga en cuenta que aunque hacemos todo lo posible para estar disponibles para asuntos urgentes fuera del horario de [place], no estamos disponibles las 24 horas del da, los 7 [address]  [address] de la [place].   Si tiene un problema urgente y no puede  comunicarse con nosotros, puede optar por buscar atencin mdica  en el consultorio de su doctor(a), en una clnica privada, en un centro de atencin urgente o en una sala de emergencias.  Si tiene Engineer, drilling, por favor llame inmediatamente al 911 o vaya a la sala de emergencias.  Nmeros de bper  - Dr. Gwen Pounds: [phone]  - Dra. Roseanne Reno: [phone]  - Dr. Katrinka Blazing: [phone]   En caso de inclemencias del tiempo, por favor llame a Lacy Duverney principal al [phone] para una actualizacin sobre el [place] de cualquier retraso o cierre.  Consejos para la medicacin en dermatologa: Por favor, guarde las cajas en las que vienen los medicamentos de uso tpico para ayudarle a seguir las instrucciones sobre dnde y cmo usarlos. Las farmacias generalmente imprimen las instrucciones del medicamento slo en las cajas y no directamente en los tubos del [place].   Si su medicamento es muy caro, por favor, pngase en contacto con Rolm Gala llamando al [phone] y presione la opcin 4 o envenos un mensaje a travs de Clinical cytogeneticist.   No podemos decirle cul ser su copago por los medicamentos por adelantado ya que esto es diferente dependiendo de la cobertura de su seguro. Sin embargo, es posible que podamos encontrar un medicamento sustituto a Audiological scientist un formulario para que el seguro cubra el medicamento que se considera necesario.   Si se requiere una autorizacin previa para que su compaa de seguros [place] su medicamento, por favor permtanos de 1 a 2 das hbiles para completar [address]  Los precios de los medicamentos varan con frecuencia dependiendo del Environmental consultant de dnde se surte la receta y alguna farmacias pueden ofrecer precios ms baratos.  El sitio web www.goodrx.com tiene cupones para medicamentos de Health and safety inspector. Los precios aqu no tienen en cuenta lo que podra costar con la ayuda del seguro (puede ser ms barato con su seguro), pero  el sitio web puede darle el precio si no utiliz Tourist information centre manager.  - Puede imprimir el cupn correspondiente y llevarlo con su receta a la farmacia.  - Tambin puede pasar por nuestra oficina durante el horario de atencin regular y Education officer, museum una tarjeta de cupones de GoodRx.  - Si necesita que su receta se enve electrnicamente a Psychiatrist, informe a nuestra oficina a travs de MyChart  de [facility] o por telfono llamando al [phone] y presione la opcin 4.

## 2023-12-11 NOTE — Progress Notes (Signed)
Follow-Up Visit   Subjective  Megan Nichols is a 67 y.o. female who presents for the following: Skin Cancer Screening and Full Body Skin Exam Hx of aks, hx of isks, hx of viral warts at left palm,  Patient reports some spots at mid forehead, left shoulder, and left cheek areas near nose.   The patient presents for Total-Body Skin Exam (TBSE) for skin cancer screening and mole check. The patient has spots, moles and lesions to be evaluated, some may be new or changing and the patient may have concern these could be cancer.  The following portions of the chart were reviewed this encounter and updated as appropriate: medications, allergies, medical history  Review of Systems:  No other skin or systemic complaints except as noted in HPI or Assessment and Plan.  Objective  Well appearing patient in no apparent distress; mood and affect are within normal limits.  A full examination was performed including scalp, head, eyes, ears, nose, lips, neck, chest, axillae, abdomen, back, buttocks, bilateral upper extremities, bilateral lower extremities, hands, feet, fingers, toes, fingernails, and toenails. All findings within normal limits unless otherwise noted below.   Relevant physical exam findings are noted in the Assessment and Plan.  forehead x 1, left cheek x 1 , left posterior shoulder x 1 (3) Erythematous stuck-on, waxy papule or plaque forehead Stuck-on, waxy, tan-brown papules and/or plaques   right palm x 1 Verrucous papules -- Discussed viral etiology and contagion.   Assessment & Plan   SKIN CANCER SCREENING PERFORMED TODAY.  ACTINIC DAMAGE - Chronic condition, secondary to cumulative UV/sun exposure - diffuse scaly erythematous macules with underlying dyspigmentation - Recommend daily broad spectrum sunscreen SPF 30+ to sun-exposed areas, reapply every 2 hours as needed.  - Staying in the shade or wearing long sleeves, sun glasses (UVA+UVB protection) and wide brim hats  (4-inch brim around the entire circumference of the hat) are also recommended for sun protection.  - Call for new or changing lesions.  LENTIGINES, SEBORRHEIC KERATOSES, HEMANGIOMAS - Benign normal skin lesions - Benign-appearing - Call for any changes  Sks at face   MELANOCYTIC NEVI - Tan-brown and/or pink-flesh-colored symmetric macules and papules - Benign appearing on exam today - Observation - Call clinic for new or changing moles - Recommend daily use of broad spectrum spf 30+ sunscreen to sun-exposed areas.   Induration, skin left upper paranasal Exam: Flesh slightly elevated slightly indurated thin nodule, 9.0 x 6.0 mm, appears as normal thickened skin. No history of bleeding to this area- present for years. Scaliness has resolved with cryotherapy last visit.    Benign-appearing slightly thickened skin.  Observe for changes.  Call clinic for new or changing lesions.  Recommend daily use of broad spectrum spf 30+ sunscreen to sun-exposed areas.    RTC if changing   INFLAMED SEBORRHEIC KERATOSIS (3) forehead x 1, left cheek x 1 , left posterior shoulder x 1 (3) Symptomatic, irritating, patient would like treated. Destruction of lesion - forehead x 1, left cheek x 1 , left posterior shoulder x 1 (3) Complexity: simple   Destruction method: cryotherapy   Informed consent: discussed and consent obtained   Timeout:  patient name, date of birth, surgical site, and procedure verified Lesion destroyed using liquid nitrogen: Yes   Region frozen until ice ball extended beyond lesion: Yes   Outcome: patient tolerated procedure well with no complications   Post-procedure details: wound care instructions given   SEBORRHEIC KERATOSIS forehead Reassured benign age-related growth.  Recommend observation.  Discussed cryotherapy if spot(s) become irritated or inflamed.  Discussed cosmetic procedure forehead, noncovered.  $60 for 1st lesion and $15 for each additional lesion if done on  the same day.  Maximum charge $350.  One touch-up treatment included no charge. Discussed risks of treatment including dyspigmentation, small scar, and/or recurrence. Recommend daily broad spectrum sunscreen SPF 30+/photoprotection to treated areas once healed.  VIRAL WARTS, UNSPECIFIED TYPE right palm x 1 Viral Wart (HPV) Counseling  Discussed viral / HPV (Human Papilloma Virus) etiology and risk of spread /infectivity to other areas of body as well as to other people.  Multiple treatments and methods may be required to clear warts and it is possible treatment may not be successful.  Treatment risks include discoloration; scarring and there is still potential for wart recurrence. Destruction of lesion - right palm x 1 Complexity: simple   Destruction method: cryotherapy   Informed consent: discussed and consent obtained   Timeout:  patient name, date of birth, surgical site, and procedure verified Lesion destroyed using liquid nitrogen: Yes   Region frozen until ice ball extended beyond lesion: Yes   Outcome: patient tolerated procedure well with no complications   Post-procedure details: wound care instructions given   Return for 1 year tbse.  IAsher Muir, CMA, am acting as scribe for Armida Sans, MD.   Documentation: I have reviewed the above documentation for accuracy and completeness, and I agree with the above.  Armida Sans, MD

## 2023-12-16 ENCOUNTER — Encounter: Payer: Self-pay | Admitting: Dermatology

## 2024-01-06 ENCOUNTER — Telehealth: Payer: Self-pay | Admitting: Cardiology

## 2024-01-06 MED ORDER — REPATHA 140 MG/ML ~~LOC~~ SOSY: 1.0000 | PREFILLED_SYRINGE | SUBCUTANEOUS | 4 refills | Status: DC

## 2024-01-06 NOTE — Telephone Encounter (Signed)
 Requested Prescriptions   Pending Prescriptions Disp Refills   Evolocumab (REPATHA) 140 MG/ML SOSY 2 mL 4    Sig: Inject 140 mg into the skin every 14 (fourteen) days.

## 2024-01-06 NOTE — Telephone Encounter (Signed)
*  STAT* If patient is at the pharmacy, call can be transferred to refill team.   1. Which medications need to be refilled? (please list name of each medication and dose if known)   Evolocumab  (REPATHA ) 140 MG/ML SOSY   2. Which pharmacy/location (including street and city if local pharmacy) is medication to be sent to? [id number] [place], [place] - [address] AT [facility] OF [address] [facility] Phone: [phone]  Fax: [phone]     3. Do they need a 30 day or 90 day supply?

## 2024-02-05 ENCOUNTER — Ambulatory Visit: Payer: Medicare Other | Admitting: Dermatology

## 2024-03-09 ENCOUNTER — Inpatient Hospital Stay: Payer: Medicare Other | Attending: Oncology

## 2024-03-09 DIAGNOSIS — D751 Secondary polycythemia: Secondary | ICD-10-CM | POA: Insufficient documentation

## 2024-03-09 LAB — CBC WITH DIFFERENTIAL (CANCER CENTER ONLY)
Abs Immature Granulocytes: 0.04 10*3/uL (ref 0.00–0.07)
Basophils Absolute: 0.1 10*3/uL (ref 0.0–0.1)
Basophils Relative: 1 %
Eosinophils Absolute: 0.1 10*3/uL (ref 0.0–0.5)
Eosinophils Relative: 2 %
HCT: 42.2 % (ref 36.0–46.0)
Hemoglobin: 13.8 g/dL (ref 12.0–15.0)
Immature Granulocytes: 1 %
Lymphocytes Relative: 33 %
Lymphs Abs: 2.6 10*3/uL (ref 0.7–4.0)
MCH: 30.6 pg (ref 26.0–34.0)
MCHC: 32.7 g/dL (ref 30.0–36.0)
MCV: 93.6 fL (ref 80.0–100.0)
Monocytes Absolute: 0.7 10*3/uL (ref 0.1–1.0)
Monocytes Relative: 9 %
Neutro Abs: 4.4 10*3/uL (ref 1.7–7.7)
Neutrophils Relative %: 54 %
Platelet Count: 301 10*3/uL (ref 150–400)
RBC: 4.51 MIL/uL (ref 3.87–5.11)
RDW: 14.4 % (ref 11.5–15.5)
WBC Count: 8 10*3/uL (ref 4.0–10.5)
nRBC: 0 % (ref 0.0–0.2)

## 2024-03-16 ENCOUNTER — Inpatient Hospital Stay: Payer: Medicare Other | Admitting: Oncology

## 2024-03-16 ENCOUNTER — Encounter: Payer: Self-pay | Admitting: Oncology

## 2024-03-16 DIAGNOSIS — D751 Secondary polycythemia: Secondary | ICD-10-CM

## 2024-03-16 NOTE — Assessment & Plan Note (Addendum)
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 Marland Kitchen  JAK2 [id number] mutation negative, with reflex to other mutations CALR, MPL, JAK 2 Ex 12-15 mutations negative. Elevated carbomonoxide level No erythrocytosis. Hct is < 50, no need for phlebotomy.  Encourage smoke cessation efforts.

## 2024-03-16 NOTE — Progress Notes (Addendum)
 HEMATOLOGY-ONCOLOGY TeleHEALTH VISIT PROGRESS NOTE  I connected with Megan Nichols on 03/16/24  at  2:30 PM EST by video enabled telemedicine visit and verified that I am speaking with the correct person using two identifiers. I discussed the limitations, risks, security and privacy concerns of performing an evaluation and management service by telemedicine and the availability of in-person appointments. The patient expressed understanding and agreed to proceed.   Other persons participating in the visit and their role in the encounter:  None  Patient's location: Home  Provider's location: office Chief Complaint: erythrocytosis   INTERVAL HISTORY Megan Nichols is a 68 y.o. female who has above history reviewed by me today presents for follow up visit for management of secondary erythrocytosis Patient presents virtually. No new complaints She also has smoked less cigarettes.   Review of Systems  Constitutional:  Negative for appetite change, chills, fatigue and fever.  HENT:   Negative for hearing loss and voice change.   Eyes:  Negative for eye problems.  Respiratory:  Negative for chest tightness and cough.   Cardiovascular:  Negative for chest pain.  Gastrointestinal:  Negative for abdominal distention, abdominal pain and blood in stool.  Endocrine: Negative for hot flashes.  Genitourinary:  Negative for difficulty urinating and frequency.   Musculoskeletal:  Negative for arthralgias.  Skin:  Negative for itching and rash.  Neurological:  Negative for extremity weakness.  Hematological:  Negative for adenopathy.  Psychiatric/Behavioral:  Negative for confusion.      Past Medical History:  Diagnosis Date   Allergy    Aortic atherosclerosis (HCC)    Arthritis    Cervical disc disease    Cervicalgia    Chronic sinusitis    Coronary artery disease    Degeneration of intervertebral disc of lumbar region    Depression    Difficult intubation    Difficult intubation     Family history of adverse reaction to anesthesia    sister has PONV   Hyperlipidemia    Impaired fasting glucose 05/21/2016   Irritable bowel syndrome with constipation    Obesity, Class I, BMI 30.0-34.9 (see actual BMI)    Pelvic floor dysfunction    Tobacco use 12/26/2015   Past Surgical History:  Procedure Laterality Date   APPENDECTOMY     BLADDER SURGERY     BREAST CYST ASPIRATION Left    1999   Chronic venous insufficiency     COLONOSCOPY WITH PROPOFOL N/A 08/22/2023   Procedure: COLONOSCOPY WITH PROPOFOL;  Surgeon: Regis Bill, MD;  Location: [facility];  Service: Endoscopy;  Laterality: N/A;   ENDOSCOPIC CONCHA BULLOSA RESECTION Right 04/17/2023   Procedure: ENDOSCOPIC CONCHA BULLOSA RESECTION;  Surgeon: Vernie Murders, MD;  Location: [facility];  Service: ENT;  Laterality: Right;   HEMORRHOID SURGERY     NASAL TURBINATE REDUCTION Right 04/17/2023   Procedure: INFERIOR TURBINATE REDUCTION;  Surgeon: Vernie Murders, MD;  Location: [facility];  Service: ENT;  Laterality: Right;   POLYPECTOMY  08/22/2023   Procedure: POLYPECTOMY;  Surgeon: Regis Bill, MD;  Location: [facility] ENDOSCOPY;  Service: Endoscopy;;   RECTOCELE REPAIR N/A 10/09/2018   Procedure: POSTERIOR REPAIR (RECTOCELE);  Surgeon: Schermerhorn, Ihor Austin, MD;  Location: [facility] ORS;  Service: Gynecology;  Laterality: N/A;   SEPTOPLASTY N/A 04/17/2023   Procedure: SEPTOPLASTY;  Surgeon: Vernie Murders, MD;  Location: [facility];  Service: ENT;  Laterality: N/A;   TONSILLECTOMY     TONSILLECTOMY     TUBAL LIGATION  Varicose veins Bilateral     Family History  Problem Relation Age of Onset   Diabetes Father    Heart disease Father    Hypothyroidism Father    Hypertension Father    Leukemia Father    Cancer Father    Intracerebral hemorrhage Mother    AAA (abdominal aortic aneurysm) Mother    Stroke Mother    Hypothyroidism Sister    Hypertension Sister    Stroke  Maternal Grandmother    Heart disease Maternal Grandmother    Cancer Maternal Grandfather        ?   Diabetes Paternal Grandmother    Heart attack Paternal Grandfather    Hypothyroidism Sister    Hypertension Sister    Hypothyroidism Sister    Hypertension Sister    Breast cancer Neg Hx     Social History   Socioeconomic History   Marital status: Divorced    Spouse name: Not on file   Number of children: Not on file   Years of education: Not on file   Highest education level: Not on file  Occupational History   Not on file  Tobacco Use   Smoking status: Every Day    Current packs/day: 1.00    Average packs/day: 1 pack/day for 30.0 years (30.0 ttl pk-yrs)    Types: Cigarettes   Smokeless tobacco: Never  Vaping Use   Vaping status: Never Used  Substance and Sexual Activity   Alcohol use: Not Currently   Drug use: No   Sexual activity: Not Currently  Other Topics Concern   Not on file  Social History Narrative   Not on file   Social Drivers of Health   Financial Resource Strain: Low Risk  (03/12/2024)   Received from [facility]   Overall Financial Resource Strain (CARDIA)    Difficulty of Paying Living Expenses: Not hard at all  Food Insecurity: No Food Insecurity (03/12/2024)   Received from [facility]   Hunger Vital Sign    Worried About Running Out of Food in the Last Year: Never true    Ran Out of Food in the Last Year: Never true  Transportation Needs: No Transportation Needs (03/12/2024)   Received from [facility] - Transportation    In the past 12 months, has lack of transportation kept you from medical appointments or from getting medications?: No    Lack of Transportation (Non-Medical): No  Physical Activity: Not on file  Stress: Not on file  Social Connections: Not on file  Intimate Partner Violence: Not on file    Current Outpatient Medications on File Prior to Visit  Medication Sig  Dispense Refill   albuterol (VENTOLIN HFA) 108 (90 Base) MCG/ACT inhaler Inhale 2 puffs into the lungs every 4 (four) hours as needed for wheezing or shortness of breath.     Evolocumab (REPATHA) 140 MG/ML SOSY Inject 140 mg into the skin every 14 (fourteen) days. 2 mL 4   Multiple Vitamins-Minerals (PRESERVISION AREDS 2 PO) Take by mouth.     pravastatin (PRAVACHOL) 80 MG tablet Take 1 tablet (80 mg total) by mouth at bedtime. 90 tablet 1   Spacer/Aero-Holding Chambers (AEROCHAMBER HOLDING CHAMBER) DEVI by Does not apply route as directed.     tiZANidine (ZANAFLEX) 2 MG tablet Take 2 mg by mouth at bedtime.     No current facility-administered medications on file prior to visit.    Allergies  Allergen Reactions  Aspirin     Foaming at the mouth Other reaction(s): Other (See Comments) Foaming at the mouth       Observations/Objective: There were no vitals filed for this visit.  There is no height or weight on file to calculate BMI.  Physical Exam Neurological:     Mental Status: She is alert.     Labs    Latest Ref Rng & Units 03/09/2024   12:28 PM 03/06/2023   10:50 AM 03/05/2022    7:50 AM  CBC  WBC 4.0 - 10.5 K/uL 8.0  9.3  6.0   Hemoglobin 12.0 - 15.0 g/dL 86.5  78.4  69.6   Hematocrit 36.0 - 46.0 % 42.2  40.7  44.8   Platelets 150 - 400 K/uL 301  312  250       Latest Ref Rng & Units 04/13/2023    1:51 PM 07/02/2021    6:11 AM 03/16/2021    9:59 AM  CMP  Glucose 70 - 99 mg/dL 93  90  92   BUN 8 - 23 mg/dL 21  15  15    Creatinine 0.44 - 1.00 mg/dL 2.95  2.84  1.32   Sodium 135 - 145 mmol/L 137  137  137   Potassium 3.5 - 5.1 mmol/L 3.8  3.7  4.5   Chloride 98 - 111 mmol/L 104  102  100   CO2 22 - 32 mmol/L 26  25  23    Calcium 8.9 - 10.3 mg/dL 9.3  8.6  9.5   Total Protein 6.5 - 8.1 g/dL 6.5     Total Bilirubin 0.3 - 1.2 mg/dL 0.5     Alkaline Phos 38 - 126 U/L 58     AST 15 - 41 U/L 19     ALT 0 - 44 U/L 18      ASSESSMENT & PLAN:   Secondary  erythrocytosis # 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 Marland Kitchen  JAK2 V617F mutation negative, with reflex to other mutations CALR, MPL, JAK 2 Ex 12-15 mutations negative. Elevated carbomonoxide level No erythrocytosis. Hct is < 50, no need for phlebotomy.  Encourage smoke cessation efforts.   Follow up PRN  All questions were answered. The patient knows to call the clinic with any problems, questions or concerns.  Rickard Patience, MD, PhD [facility] Hematology Oncology 03/16/2024

## 2024-03-17 ENCOUNTER — Ambulatory Visit: Payer: Medicare Other | Admitting: Dermatology

## 2024-03-30 ENCOUNTER — Ambulatory Visit: Admitting: Dermatology

## 2024-04-19 ENCOUNTER — Ambulatory Visit: Admitting: Dermatology

## 2024-04-19 ENCOUNTER — Encounter: Payer: Self-pay | Admitting: Dermatology

## 2024-04-19 DIAGNOSIS — L821 Other seborrheic keratosis: Secondary | ICD-10-CM

## 2024-04-19 NOTE — Progress Notes (Signed)
   Follow-Up Visit   Subjective  Megan Nichols is a 68 y.o. female who presents for the following: cosmetic treatment of Sks. Forehead. Here for LN2 treatment.   The patient has spots, moles and lesions to be evaluated, some may be new or changing and the patient may have concern these could be cancer.  The following portions of the chart were reviewed this encounter and updated as appropriate: medications, allergies, medical history  Review of Systems:  No other skin or systemic complaints except as noted in HPI or Assessment and Plan.  Objective  Well appearing patient in no apparent distress; mood and affect are within normal limits.  A focused examination was performed of the following areas: Face   Relevant physical exam findings are noted in the Assessment and Plan.           Assessment & Plan   Seborrheic Keratoses- Removal desired by patient Cosmetic self pay fee $350 - Waxy tan papules - Benign appearing.  - Patient desires removal. Reviewed that this is not covered by insurance and they will be charged a cosmetic fee for removal. Patient signed non-covered consent.  - Prior to procedure, discussed risks of blister formation, small wound, skin dyspigmentation, or rare scar following cryotherapy.   Destruction Procedure Note Destruction method: cryotherapy   Informed consent: discussed and consent obtained   Lesion destroyed using liquid nitrogen: Yes   Outcome: patient tolerated procedure well with no complications   Post-procedure details: wound care instructions given   Locations: face, mostly forehead # of Lesions Treated: 43   Discussed cosmetic procedure forehead, noncovered.  $60 for 1st lesion and $15 for each additional lesion if done on the same day.  Maximum charge $350.  One touch-up treatment included no charge. Discussed risks of treatment including dyspigmentation, small scar, and/or recurrence. Recommend daily broad spectrum sunscreen SPF  30+/photoprotection to treated areas once healed.    Return for SK follow up in late October. Anastasio Balsam, CMA, am acting as scribe for Celine Collard, MD.   Documentation: I have reviewed the above documentation for accuracy and completeness, and I agree with the above.  Celine Collard, MD

## 2024-04-19 NOTE — Patient Instructions (Addendum)
 Cryotherapy Aftercare  Wash gently with soap and water everyday.   Apply Vaseline Jelly daily until healed.     Recommend daily broad spectrum sunscreen SPF 30+ to sun-exposed areas, reapply every 2 hours as needed. Call for new or changing lesions.  Staying in the shade or wearing long sleeves, sun glasses (UVA+UVB protection) and wide brim hats (4-inch brim around the entire circumference of the hat) are also recommended for sun protection.      Due to recent changes in healthcare laws, you may see results of your pathology and/or laboratory studies on MyChart before the doctors have had a chance to review them. We understand that in some cases there may be results that are confusing or concerning to you. Please understand that not all results are received at the same time and often the doctors may need to interpret multiple results in order to provide you with the best plan of care or course of treatment. Therefore, we ask that you please give [place] 2 business days to thoroughly review all your results before contacting the office for clarification. Should we see a critical lab result, you will be contacted sooner.   If You Need Anything After Your Visit  If you have any questions or concerns for your doctor, please call our main line at [phone] and press option 4 to reach your doctor's medical assistant. If no one answers, please leave a voicemail as directed and we will return your call as soon as possible. Messages left after 4 pm will be answered the following business day.   You may also send [place] a message via MyChart. We typically respond to MyChart messages within 1-2 business days.  For prescription refills, please ask your pharmacy to contact our office. Our fax number is [phone].  If you have an urgent issue when the clinic is closed that cannot wait until the next business day, you can page your doctor at the number below.    Please note that while we do our best to be  available for urgent issues outside of office hours, we are not available 24/7.   If you have an urgent issue and are unable to reach [place], you may choose to seek medical care at your doctor's office, retail clinic, urgent care center, or emergency room.  If you have a medical emergency, please immediately call 911 or go to the emergency department.  Pager Numbers  - Dr. Gwen Pounds: [phone]  - Dr. Roseanne Reno: [phone]  - Dr. Katrinka Blazing: [phone]   In the event of inclement weather, please call our main line at [phone] for an update on the status of any delays or closures.  Dermatology Medication Tips: Please keep the boxes that topical medications come in in order to help keep track of the instructions about where and how to use these. Pharmacies typically print the medication instructions only on the boxes and not directly on the medication tubes.   If your medication is too expensive, please contact our office at [phone] option 4 or send [place] a message through MyChart.   We are unable to tell what your co-pay for medications will be in advance as this is different depending on your insurance coverage. However, we may be able to find a substitute medication at lower cost or fill out paperwork to get insurance to cover a needed medication.   If a prior authorization is required to get your medication covered by your insurance company, please allow [place] 1-2 business days to complete  this process.  Drug prices often vary depending on where the prescription is filled and some pharmacies may offer cheaper prices.  The website www.goodrx.com contains coupons for medications through different pharmacies. The prices here do not account for what the cost may be with help from insurance (it may be cheaper with your insurance), but the website can give you the price if you did not use any insurance.  - You can print the associated coupon and take it with your prescription to the pharmacy.   - You may also stop by our office during regular business hours and pick up a GoodRx coupon card.  - If you need your prescription sent electronically to a different pharmacy, notify our office through [facility] or by phone at [phone] option 4.     Si Usted Necesita Algo Despus de Su Visita  Tambin puede enviarnos un mensaje a travs de Clinical cytogeneticist. Por lo general respondemos a los mensajes de MyChart en el transcurso de 1 a 2 das hbiles.  Para renovar recetas, por favor pida a su farmacia que se ponga en contacto con nuestra oficina. Annie Sable de fax es [place] [phone].  Si tiene un asunto urgente cuando la clnica est cerrada y que no puede esperar hasta el siguiente da hbil, puede llamar/localizar a su doctor(a) al nmero que aparece a continuacin.   Por favor, tenga en cuenta que aunque hacemos todo lo posible para estar disponibles para asuntos urgentes fuera del horario de [place], no estamos disponibles las 24 horas del da, los 7 [address]  [address] de la [place].   Si tiene un problema urgente y no puede comunicarse con nosotros, puede optar por buscar atencin mdica  en el consultorio de su doctor(a), en una clnica privada, en un centro de atencin urgente o en una sala de emergencias.  Si tiene Engineer, drilling, por favor llame inmediatamente al 911 o vaya a la sala de emergencias.  Nmeros de bper  - Dr. Gwen Pounds: [phone]  - Dra. Roseanne Reno: [phone]  - Dr. Katrinka Blazing: [phone]   En caso de inclemencias del tiempo, por favor llame a Lacy Duverney principal al [phone] para una actualizacin sobre el [place] de cualquier retraso o cierre.  Consejos para la medicacin en dermatologa: Por favor, guarde las cajas en las que vienen los medicamentos de uso tpico para ayudarle a seguir las instrucciones sobre dnde y cmo usarlos. Las farmacias generalmente imprimen las instrucciones del medicamento slo en las cajas y no directamente en los tubos del  [place].   Si su medicamento es muy caro, por favor, pngase en contacto con Rolm Gala llamando al [phone] y presione la opcin 4 o envenos un mensaje a travs de Clinical cytogeneticist.   No podemos decirle cul ser su copago por los medicamentos por adelantado ya que esto es diferente dependiendo de la cobertura de su seguro. Sin embargo, es posible que podamos encontrar un medicamento sustituto a Audiological scientist un formulario para que el seguro cubra el medicamento que se considera necesario.   Si se requiere una autorizacin previa para que su compaa de seguros [place] su medicamento, por favor permtanos de 1 a 2 das hbiles para completar [address]  Los precios de los medicamentos varan con frecuencia dependiendo del Environmental consultant de dnde se surte la receta y alguna farmacias pueden ofrecer precios ms baratos.  El sitio web www.goodrx.com tiene cupones para medicamentos de Health and safety inspector. Los precios aqu no tienen en cuenta lo que podra costar con la ayuda del  seguro (puede ser ms barato con su seguro), pero el sitio web puede darle el precio si no Visual merchandiser.  - Puede imprimir el cupn correspondiente y llevarlo con su receta a la farmacia.  - Tambin puede pasar por nuestra oficina durante el horario de atencin regular y Education officer, museum una tarjeta de cupones de GoodRx.  - Si necesita que su receta se enve electrnicamente a una farmacia diferente, informe a nuestra oficina a travs de MyChart de [place] o por telfono llamando al [phone] y presione la opcin 4.

## 2024-05-13 ENCOUNTER — Telehealth: Payer: Self-pay | Admitting: Cardiology

## 2024-05-13 MED ORDER — REPATHA 140 MG/ML ~~LOC~~ SOSY: 1.0000 | PREFILLED_SYRINGE | SUBCUTANEOUS | 1 refills | Status: DC

## 2024-05-13 MED ORDER — REPATHA 140 MG/ML ~~LOC~~ SOSY: 1.0000 | PREFILLED_SYRINGE | SUBCUTANEOUS | 5 refills | Status: DC

## 2024-05-13 NOTE — Telephone Encounter (Signed)
*  STAT* If patient is at the pharmacy, call can be transferred to refill team.   1. Which medications need to be refilled? (please list name of each medication and dose if known)   Evolocumab  (REPATHA ) 140 MG/ML SOSY    2. Which pharmacy/location (including street and city if local pharmacy) is medication to be sent to? [id number] [place], [place] - [address] AT [facility] OF [address] [facility] Phone: [phone]  Fax: [phone]     3. Do they need a 30 day or 90 day supply? 90

## 2024-05-20 ENCOUNTER — Other Ambulatory Visit: Payer: Self-pay

## 2024-05-20 MED ORDER — PRAVASTATIN SODIUM 80 MG PO TABS: 80.0000 mg | ORAL_TABLET | Freq: Every day | ORAL | 3 refills | Status: AC

## 2024-06-21 NOTE — Progress Notes (Unsigned)
 Cardiology Clinic Note   Date: 06/23/2024 ID: Kanae, Ignatowski 07/24/1956, MRN [id number]  Primary Cardiologist:  Redell Cave, MD  Chief Complaint   Megan Nichols is a 68 y.o. female who presents to the clinic today for routine follow up.   Patient Profile   Megan Nichols is followed by Dr. Cave for the history outlined below.      Past medical history significant for: Nonobstructive CAD. Coronary CTA 04/05/2021: Coronary calcium score 192 (88 percentile).  Calcified and noncalcified plaque causing mild stenosis proximal LAD.  Calcified plaque causing minimal stenosis in mid LCx. Echo 04/05/2021: EF 55 to 60%.  No RWMA.  Normal diastolic parameters.  Normal RV size/function.  No significant valvular abnormalities. Hyperlipidemia. Lipid panel 03/05/2024: LDL 50, HDL 86, TG 67, total 159. Varicose veins. Tobacco abuse. Anxiety. Family history of early CAD.  In summary, patient was found evaluated by Dr. Cave on 03/16/2021 for chest pain.  She had been seen in the ED 3 days prior with an unremarkable workup.  Patient reported family history of CAD in her dad at around age 53.  She reported statin intolerance secondary to myalgias.  She underwent coronary CTA which demonstrated mild nonobstructive CAD.  She had a normal echo.  Patient was last seen in the office by Dr. Cave on 05/16/2023 for routine follow-up.  She was doing well at that time with no cardiac complaints.  No medication changes were made.     History of Present Illness    Today, patient is doing well. Patient denies shortness of breath, dyspnea on exertion, lower extremity edema, orthopnea or PND. No chest pain, pressure, or tightness. No palpitations.  She is very active performing cardio videos for 30 minutes every morning and caring for her young grandchildren during the day. She continues to smoke 3/4 pack of cigarettes a day.     ROS: All other systems reviewed and are otherwise  negative except as noted in History of Present Illness.  EKGs/Labs Reviewed    EKG Interpretation Date/Time:  Wednesday June 23 2024 13:41:07 EDT Ventricular Rate:  72 PR Interval:  154 QRS Duration:  94 QT Interval:  386 QTC Calculation: 422 R Axis:   -34  Text Interpretation: Normal sinus rhythm Left axis deviation Incomplete right bundle branch block When compared with ECG of 05/16/2023 (not in [place]) No significant change Confirmed by Loistine Sober [phone]) on 06/23/2024 1:45:06 PM   03/09/2024: Hemoglobin 13.8; WBC Count 8.0    Physical Exam    VS:  BP 126/70 (BP Location: Left Arm, Patient Position: Sitting, Cuff Size: Normal)   Pulse 71   Resp 18   Ht 5' 3 (1.6 m)   Wt 176 lb 6.4 oz (80 kg)   SpO2 94%   BMI 31.25 kg/m  , BMI Body mass index is 31.25 kg/m.  GEN: Well nourished, well developed, in no acute distress. Neck: No JVD or carotid bruits. Cardiac:  RRR.  No murmur. No rubs or gallops.   Respiratory:  Respirations regular and unlabored. Clear to auscultation without rales, wheezing or rhonchi. GI: Soft, nontender, nondistended. Extremities: Radials/DP/PT 2+ and equal bilaterally. No clubbing or cyanosis. No edema.  Skin: Warm and dry, no rash. Neuro: Strength intact.  Assessment & Plan   Nonobstructive CAD Coronary CTA April 2022 demonstrated calcium score 192 with mild calcified and noncalcified plaque in the proximal LAD and minimal calcified plaque in the mid LCx.  Echo demonstrated normal LV/RV function, normal diastolic  parameters, no significant valvular abnormalities.  Patient denies chest pain, pressure or tightness. She is very active performing 30 minutes of cardio each morning. She cares for her young grandchildren during the day.  - Continue Repatha  and pravastatin .  Hyperlipidemia/statin myopathy LDL 50 March 2025, at goal.  Patient reported muscle pain on statin.  She is currently tolerating pravastatin  without difficulty. - Continue Repatha   and pravastatin .  Tobacco abuse Patient continues to smoke 3/4 pack of cigarettes a day. She is not ready to quit.  - Encouraged creating small goals to cut back on cigarettes until complete cessation.   Disposition: Return in 1 year or sooner as needed.          Signed, Barnie HERO. Shacara Cozine, DNP, NP-C

## 2024-06-23 ENCOUNTER — Encounter: Payer: Self-pay | Admitting: Student

## 2024-06-23 ENCOUNTER — Ambulatory Visit: Attending: Student | Admitting: Student

## 2024-06-23 VITALS — BP 126/70 | HR 71 | Resp 18 | Ht 63.0 in | Wt 176.4 lb

## 2024-06-23 DIAGNOSIS — Z720 Tobacco use: Secondary | ICD-10-CM | POA: Diagnosis not present

## 2024-06-23 DIAGNOSIS — G720 Drug-induced myopathy: Secondary | ICD-10-CM

## 2024-06-23 DIAGNOSIS — T466X5A Adverse effect of antihyperlipidemic and antiarteriosclerotic drugs, initial encounter: Secondary | ICD-10-CM

## 2024-06-23 DIAGNOSIS — E785 Hyperlipidemia, unspecified: Secondary | ICD-10-CM | POA: Diagnosis not present

## 2024-06-23 DIAGNOSIS — I2510 Atherosclerotic heart disease of native coronary artery without angina pectoris: Secondary | ICD-10-CM

## 2024-06-23 NOTE — Patient Instructions (Signed)
 Medication Instructions:  Your physician recommends that you continue on your current medications as directed. Please refer to the Current Medication list given to you today.   *If you need a refill on your cardiac medications before your next appointment, please call your pharmacy*  Lab Work: None ordered at this time  If you have labs (blood work) drawn today and your tests are completely normal, you will receive your results only by: MyChart Message (if you have MyChart) OR A paper copy in the mail If you have any lab test that is abnormal or we need to change your treatment, we will call you to review the results.  Testing/Procedures: None ordered at this time   Follow-Up: At [facility], you and your health needs are our priority.  As part of our continuing mission to provide you with exceptional heart care, our providers are all part of one team.  This team includes your primary Cardiologist (physician) and Advanced Practice Providers or APPs (Physician Assistants and Nurse Practitioners) who all work together to provide you with the care you need, when you need it.  Your next appointment:   1 year(s)  Provider:   You may see Constancia Delton, MD or one of the following Advanced Practice Providers on your designated Care Team:   Laneta Pintos, NP Gildardo Labrador, PA-C Varney Gentleman, PA-C Cadence Orick, PA-C Ronald Cockayne, NP Morey Ar, NP    We recommend signing up for the patient portal called "MyChart".  Sign up information is provided on this After Visit Summary.  MyChart is used to connect with patients for Virtual Visits (Telemedicine).  Patients are able to view lab/test results, encounter notes, upcoming appointments, etc.  Non-urgent messages can be sent to your provider as well.   To learn more about what you can do with MyChart, go to [email or web address].

## 2024-09-01 ENCOUNTER — Telehealth: Payer: Self-pay | Admitting: Cardiology

## 2024-09-01 DIAGNOSIS — E785 Hyperlipidemia, unspecified: Secondary | ICD-10-CM

## 2024-09-01 DIAGNOSIS — I2510 Atherosclerotic heart disease of native coronary artery without angina pectoris: Secondary | ICD-10-CM

## 2024-09-01 MED ORDER — REPATHA SURECLICK 140 MG/ML ~~LOC~~ SOAJ: 1.0000 mL | SUBCUTANEOUS | 1 refills | Status: DC

## 2024-09-01 NOTE — Telephone Encounter (Signed)
*  STAT* If patient is at the pharmacy, call can be transferred to refill team.   1. Which medications need to be refilled? (please list name of each medication and dose if known) Evolocumab  (REPATHA ) 140 MG/ML SOSY    2. Would you like to learn more about the convenience, safety, & potential cost savings by using the [facility]? No    3. Are you open to using the [facility] (Type [facility]. ). No   4. Which pharmacy/location (including street and city if local pharmacy) is medication to be sent to? [id number] - [place], [place] - [address] AT [facility] OF [address]     5. Do they need a 30 day or 90 day supply? 90 day

## 2024-10-11 ENCOUNTER — Ambulatory Visit
Admission: RE | Admit: 2024-10-11 | Discharge: 2024-10-11 | Disposition: A | Discharge status: Home / Self Care | Source: Ambulatory Visit | Attending: *Deleted | Admitting: *Deleted

## 2024-10-11 DIAGNOSIS — R911 Solitary pulmonary nodule: Secondary | ICD-10-CM | POA: Insufficient documentation

## 2024-10-11 DIAGNOSIS — I700 Atherosclerosis of aorta: Secondary | ICD-10-CM | POA: Diagnosis not present

## 2024-10-11 DIAGNOSIS — Z122 Encounter for screening for malignant neoplasm of respiratory organs: Secondary | ICD-10-CM | POA: Diagnosis present

## 2024-10-11 DIAGNOSIS — J439 Emphysema, unspecified: Secondary | ICD-10-CM | POA: Diagnosis not present

## 2024-10-11 DIAGNOSIS — F17210 Nicotine dependence, cigarettes, uncomplicated: Secondary | ICD-10-CM | POA: Diagnosis not present

## 2024-10-11 DIAGNOSIS — Z87891 Personal history of nicotine dependence: Secondary | ICD-10-CM

## 2024-10-13 ENCOUNTER — Other Ambulatory Visit: Payer: Self-pay

## 2024-10-13 ENCOUNTER — Telehealth: Payer: Self-pay

## 2024-10-13 DIAGNOSIS — R911 Solitary pulmonary nodule: Secondary | ICD-10-CM

## 2024-10-13 DIAGNOSIS — F17210 Nicotine dependence, cigarettes, uncomplicated: Secondary | ICD-10-CM

## 2024-10-13 DIAGNOSIS — Z122 Encounter for screening for malignant neoplasm of respiratory organs: Secondary | ICD-10-CM

## 2024-10-13 DIAGNOSIS — Z87891 Personal history of nicotine dependence: Secondary | ICD-10-CM

## 2024-10-13 NOTE — Telephone Encounter (Signed)
 Called patient and reviewed recent Lung CT results. She is in agreement to complete a 6 month follow up scan to evaluate the new 4 mm nodule. Order placed. Scheduled for 04/13/2025 at [facility]. 6 month follow up due to new 4mm nodule since last annual scan. Results and plan to PCP.   IMPRESSION: 1. Lung-RADS 3, probably benign findings. New 4 mm right lower lobe pulmonary nodule. Short-term follow-up in 6 months is recommended with repeat low-dose chest CT without contrast (please use the following order, CT CHEST LCS NODULE FOLLOW-UP W/O CM). 2.  Emphysema (ICD10-J43.9) and Aortic Atherosclerosis (ICD10-170.0)

## 2024-10-27 ENCOUNTER — Ambulatory Visit: Admitting: Dermatology

## 2024-11-23 ENCOUNTER — Ambulatory Visit: Admitting: Podiatry

## 2024-11-23 DIAGNOSIS — Q828 Other specified congenital malformations of skin: Secondary | ICD-10-CM | POA: Diagnosis not present

## 2024-11-23 NOTE — Progress Notes (Signed)
 Subjective:  Patient ID: Megan Nichols, female    DOB: 1956/06/07,  MRN: [id number]  Chief Complaint  Patient presents with   Callouses    68 y.o. female presents with the above complaint.  Patient presents with right submetatarsal 1 porokeratotic lesion with central nucleated core painful to touch is progressive gotten worse worse with ambulation and shoe pressure has not seen anyone else prior to seeing me denies any other acute complaints.  She would like to have it debrided down.   Review of Systems: Negative except as noted in the HPI. Denies N/V/F/Ch.  Past Medical History:  Diagnosis Date   Allergy    Aortic atherosclerosis    Arthritis    Cervical disc disease    Cervicalgia    Chronic sinusitis    Coronary artery disease    Degeneration of intervertebral disc of lumbar region    Depression    Difficult intubation    Difficult intubation    Family history of adverse reaction to anesthesia    sister has PONV   Hyperlipidemia    Impaired fasting glucose 05/21/2016   Irritable bowel syndrome with constipation    Obesity, Class I, BMI 30.0-34.9 (see actual BMI)    Pelvic floor dysfunction    Tobacco use 12/26/2015    Current Outpatient Medications:    albuterol  (VENTOLIN  HFA) 108 (90 Base) MCG/ACT inhaler, Inhale 2 puffs into the lungs every 4 (four) hours as needed for wheezing or shortness of breath., Disp: , Rfl:    Evolocumab  (REPATHA  SURECLICK) 140 MG/ML SOAJ, Inject 140 mg into the skin every 14 (fourteen) days., Disp: 6 mL, Rfl: 1   Multiple Vitamins-Minerals (PRESERVISION AREDS 2 PO), Take by mouth., Disp: , Rfl:    pravastatin  (PRAVACHOL ) 80 MG tablet, Take 1 tablet (80 mg total) by mouth at bedtime., Disp: 90 tablet, Rfl: 3   Spacer/Aero-Holding Chambers (AEROCHAMBER HOLDING CHAMBER) DEVI, by Does not apply route as directed., Disp: , Rfl:    tiZANidine (ZANAFLEX) 2 MG tablet, Take 2 mg by mouth at bedtime., Disp: , Rfl:   Social History   Tobacco Use   Smoking Status Every Day   Current packs/day: 1.00   Average packs/day: 1 pack/day for 30.0 years (30.0 ttl pk-yrs)   Types: Cigarettes  Smokeless Tobacco Never    Allergies  Allergen Reactions   Aspirin      Foaming at the mouth Other reaction(s): Other (See Comments) Foaming at the mouth   Objective:  There were no vitals filed for this visit. There is no height or weight on file to calculate BMI. Constitutional Well developed. Well nourished.  Vascular Dorsalis pedis pulses palpable bilaterally. Posterior tibial pulses palpable bilaterally. Capillary refill normal to all digits.  No cyanosis or clubbing noted. Pedal hair growth normal.  Neurologic Normal speech. Oriented to person, place, and time. Epicritic sensation to light touch grossly present bilaterally.  Dermatologic Right submetatarsal 1 porokeratotic lesion with central nucleated core pain on palpation.  Orthopedic: Normal joint ROM without pain or crepitus bilaterally. No visible deformities. No bony tenderness.   Radiographs: None Assessment:   1. Porokeratosis    Plan:  Patient was evaluated and treated and all questions answered.  Right submetatarsal 1 porokeratosis - All questions and concerns were discussed with the patient in extensive detail given the amount of pain that she is experiencing she would benefit from aggressive debridement of the lesion.  She is able to handle the lesion was debride down to healthy stripe tissue no  complication noted no pinpoint bleeding noted - Shoe gear modification discussed  No follow-ups on file.

## 2024-11-29 ENCOUNTER — Telehealth: Payer: Self-pay | Admitting: Pharmacist Clinician (PhC)/ Clinical Pharmacy Specialist

## 2024-11-29 ENCOUNTER — Telehealth: Payer: Self-pay | Admitting: Cardiology

## 2024-11-29 DIAGNOSIS — I2510 Atherosclerotic heart disease of native coronary artery without angina pectoris: Secondary | ICD-10-CM

## 2024-11-29 DIAGNOSIS — E785 Hyperlipidemia, unspecified: Secondary | ICD-10-CM

## 2024-11-29 MED ORDER — REPATHA SURECLICK 140 MG/ML ~~LOC~~ SOAJ: 1.0000 mL | SUBCUTANEOUS | 1 refills | Status: DC

## 2024-11-29 NOTE — Telephone Encounter (Signed)
*  STAT* If patient is at the pharmacy, call can be transferred to refill team.   1. Which medications need to be refilled? (please list name of each medication and dose if known) Evolocumab  (REPATHA  SURECLICK) 140 MG/ML SOAJ   2. Which pharmacy/location (including street and city if local pharmacy) is medication to be sent to? [id number] [place], [place] - [address] AT [facility] OF [address] [facility] Phone: [phone]  Fax: [phone]      3. Do they need a 30 day or 90 day supply? 90

## 2024-11-29 NOTE — Telephone Encounter (Signed)
 LMOM for patient to get updated lipid labs - need to update PA.

## 2024-12-01 LAB — LIPID PANEL
Chol/HDL Ratio: 1.8 ratio (ref 0.0–4.4)
Cholesterol, Total: 163 mg/dL (ref 100–199)
HDL: 91 mg/dL (ref >39)
LDL Chol Calc (NIH): 55 mg/dL (ref 0–99)
Triglycerides: 93 mg/dL (ref 0–149)
VLDL Cholesterol Cal: 17 mg/dL (ref 5–40)

## 2024-12-06 ENCOUNTER — Telehealth: Payer: Self-pay | Admitting: Cardiology

## 2024-12-06 DIAGNOSIS — E785 Hyperlipidemia, unspecified: Secondary | ICD-10-CM

## 2024-12-06 DIAGNOSIS — I2510 Atherosclerotic heart disease of native coronary artery without angina pectoris: Secondary | ICD-10-CM

## 2024-12-06 MED ORDER — REPATHA SURECLICK 140 MG/ML ~~LOC~~ SOAJ: 1.0000 mL | SUBCUTANEOUS | 1 refills | Status: AC

## 2024-12-06 NOTE — Telephone Encounter (Signed)
*  STAT* If patient is at the pharmacy, call can be transferred to refill team.   1. Which medications need to be refilled? (please list name of each medication and dose if known)   Evolocumab  (REPATHA  SURECLICK) 140 MG/ML SOAJ    2. Would you like to learn more about the convenience, safety, & potential cost savings by using the [facility]? no     3. Are you open to using the [facility] (Type [facility]. No  4. Which pharmacy/location (including street and city if local pharmacy) is medication to be sent to? No   5. Do they need a 30 day or 90 day supply? [place]

## 2024-12-08 ENCOUNTER — Telehealth: Payer: Self-pay

## 2024-12-08 ENCOUNTER — Other Ambulatory Visit (HOSPITAL_COMMUNITY): Payer: Self-pay

## 2024-12-08 NOTE — Telephone Encounter (Signed)
 Pharmacy Patient Advocate Encounter   Received notification from Fax that prior authorization for REPATHA  is required/requested.   Insurance verification completed.   The patient is insured through [facility].   Per test claim: PA required; PA submitted to above mentioned insurance via Latent Key/confirmation #/EOC [facility] Status is pending

## 2024-12-08 NOTE — Telephone Encounter (Signed)
 Pharmacy Patient Advocate Encounter  Received notification from [facility] that Prior Authorization for REPATHA  has been APPROVED from 12/08/24 to 12/08/25

## 2024-12-16 ENCOUNTER — Encounter: Payer: Self-pay | Admitting: Dermatology

## 2024-12-16 ENCOUNTER — Ambulatory Visit: Payer: Self-pay | Admitting: Pharmacist Clinician (PhC)/ Clinical Pharmacy Specialist

## 2024-12-16 ENCOUNTER — Ambulatory Visit: Payer: Medicare Other | Admitting: Dermatology

## 2024-12-16 DIAGNOSIS — W908XXA Exposure to other nonionizing radiation, initial encounter: Secondary | ICD-10-CM

## 2024-12-16 DIAGNOSIS — L821 Other seborrheic keratosis: Secondary | ICD-10-CM

## 2024-12-16 DIAGNOSIS — L814 Other melanin hyperpigmentation: Secondary | ICD-10-CM

## 2024-12-16 DIAGNOSIS — Z1283 Encounter for screening for malignant neoplasm of skin: Secondary | ICD-10-CM | POA: Diagnosis not present

## 2024-12-16 DIAGNOSIS — L820 Inflamed seborrheic keratosis: Secondary | ICD-10-CM

## 2024-12-16 DIAGNOSIS — L578 Other skin changes due to chronic exposure to nonionizing radiation: Secondary | ICD-10-CM | POA: Diagnosis not present

## 2024-12-16 DIAGNOSIS — D2372 Other benign neoplasm of skin of left lower limb, including hip: Secondary | ICD-10-CM

## 2024-12-16 DIAGNOSIS — L853 Xerosis cutis: Secondary | ICD-10-CM

## 2024-12-16 DIAGNOSIS — D239 Other benign neoplasm of skin, unspecified: Secondary | ICD-10-CM

## 2024-12-16 DIAGNOSIS — D1801 Hemangioma of skin and subcutaneous tissue: Secondary | ICD-10-CM

## 2024-12-16 DIAGNOSIS — I8393 Asymptomatic varicose veins of bilateral lower extremities: Secondary | ICD-10-CM

## 2024-12-16 DIAGNOSIS — D229 Melanocytic nevi, unspecified: Secondary | ICD-10-CM

## 2024-12-16 NOTE — Patient Instructions (Addendum)
 Try amlactin rapid relief to apply at bedtime   Can be found over the counter     Gentle Skin Care Guide  1. Bathe no more than once a day.  2. Avoid bathing in hot water   3. Use a mild soap like Dove, Vanicream, Cetaphil, CeraVe. Can use Lever 2000 or Cetaphil antibacterial soap  4. Use soap only where you need it. On most days, use it under your arms, between your legs, and on your feet. Let the water  rinse other areas unless visibly dirty.  5. When you get out of the bath/shower, use a towel to gently blot your skin dry, don't rub it.  6. While your skin is still a little damp, apply a moisturizing cream such as Vanicream, CeraVe, Cetaphil, Eucerin, Sarna lotion or plain Vaseline Jelly. For hands apply Neutrogena Norwegian Hand Cream or Excipial Hand Cream.  7. Reapply moisturizer any time you start to itch or feel dry.  8. Sometimes using free and clear laundry detergents can be helpful. Fabric softener sheets should be avoided. Downy Free & Gentle liquid, or any liquid fabric softener that is free of dyes and perfumes, it acceptable to use  9. If your doctor has given you prescription creams you may apply moisturizers over them   Seborrheic Keratosis  What causes seborrheic keratoses? Seborrheic keratoses are harmless, common skin growths that first appear during adult life.  As time goes by, more growths appear.  Some people may develop a large number of them.  Seborrheic keratoses appear on both covered and uncovered body parts.  They are not caused by sunlight.  The tendency to develop seborrheic keratoses can be inherited.  They vary in color from skin-colored to gray, brown, or even black.  They can be either smooth or have a rough, warty surface.   Seborrheic keratoses are superficial and look as if they were stuck on the skin.  Under the microscope this type of keratosis looks like layers upon layers of skin.  That is why at times the top layer may seem to fall off, but the  rest of the growth remains and re-grows.    Treatment Seborrheic keratoses do not need to be treated, but can easily be removed in the office.  Seborrheic keratoses often cause symptoms when they rub on clothing or jewelry.  Lesions can be in the way of shaving.  If they become inflamed, they can cause itching, soreness, or burning.  Removal of a seborrheic keratosis can be accomplished by freezing, burning, or surgery. If any spot bleeds, scabs, or grows rapidly, please return to have it checked, as these can be an indication of a skin cancer.  Cryotherapy Aftercare  Wash gently with soap and water  everyday.   Apply Vaseline and Band-Aid daily until healed.    Actinic keratoses are precancerous spots that appear secondary to cumulative UV radiation exposure/sun exposure over time. They are chronic with expected duration over 1 year. A portion of actinic keratoses will progress to squamous cell carcinoma of the skin. It is not possible to reliably predict which spots will progress to skin cancer and so treatment is recommended to prevent development of skin cancer.  Recommend daily broad spectrum sunscreen SPF 30+ to sun-exposed areas, reapply every 2 hours as needed.  Recommend staying in the shade or wearing long sleeves, sun glasses (UVA+UVB protection) and wide brim hats (4-inch brim around the entire circumference of the hat). Call for new or changing lesions.  Melanoma ABCDEs  Melanoma is the most dangerous type of skin cancer, and is the leading cause of death from skin disease.  You are more likely to develop melanoma if you: Have light-colored skin, light-colored eyes, or red or blond hair Spend a lot of time in the sun Tan regularly, either outdoors or in a tanning bed Have had blistering sunburns, especially during childhood Have a close family member who has had a melanoma Have atypical moles or large birthmarks  Early detection of melanoma is key since  treatment is typically straightforward and cure rates are extremely high if we catch it early.   The first sign of melanoma is often a change in a mole or a new dark spot.  The ABCDE system is a way of remembering the signs of melanoma.  A for asymmetry:  The two halves do not match. B for border:  The edges of the growth are irregular. C for color:  A mixture of colors are present instead of an even brown color. D for diameter:  Melanomas are usually (but not always) greater than 6mm - the size of a pencil eraser. E for evolution:  The spot keeps changing in size, shape, and color.  Please check your skin once per month between visits. You can use a small mirror in front and a large mirror behind you to keep an eye on the back side or your body.   If you see any new or changing lesions before your next follow-up, please call to schedule a visit.  Please continue daily skin protection including broad spectrum sunscreen SPF 30+ to sun-exposed areas, reapplying every 2 hours as needed when you're outdoors.   Staying in the shade or wearing long sleeves, sun glasses (UVA+UVB protection) and wide brim hats (4-inch brim around the entire circumference of the hat) are also recommended for sun protection.    Due to recent changes in healthcare laws, you may see results of your pathology and/or laboratory studies on MyChart before the doctors have had a chance to review them. We understand that in some cases there may be results that are confusing or concerning to you. Please understand that not all results are received at the same time and often the doctors may need to interpret multiple results in order to provide you with the best plan of care or course of treatment. Therefore, we ask that you please give us  2 business days to thoroughly review all your results before contacting the office for clarification. Should we see a critical lab result, you will be contacted sooner.   If You Need Anything  After Your Visit  If you have any questions or concerns for your doctor, please call our main line at [phone] and press option 4 to reach your doctor's medical assistant. If no one answers, please leave a voicemail as directed and we will return your call as soon as possible. Messages left after 4 pm will be answered the following business day.   You may also send us  a message via MyChart. We typically respond to MyChart messages within 1-2 business days.  For prescription refills, please ask your pharmacy to contact our office. Our fax number is [phone].  If you have an urgent issue when the clinic is closed that cannot wait until the next business day, you can page your doctor at the number below.    Please note that while we do our best to be available for urgent issues outside of office hours,  we are not available 24/7.   If you have an urgent issue and are unable to reach us , you may choose to seek medical care at your doctor's office, retail clinic, urgent care center, or emergency room.  If you have a medical emergency, please immediately call 911 or go to the emergency department.  Pager Numbers  - Dr. Hester: [phone]  - Dr. Jackquline: [phone]  - Dr. Claudene: [phone]   - Dr. Raymund: [phone]  In the event of inclement weather, please call our main line at [phone] for an update on the status of any delays or closures.  Dermatology Medication Tips: Please keep the boxes that topical medications come in in order to help keep track of the instructions about where and how to use these. Pharmacies typically print the medication instructions only on the boxes and not directly on the medication tubes.   If your medication is too expensive, please contact our office at [phone] option 4 or send us  a message through MyChart.   We are unable to tell what your co-pay for medications will be in advance as this is different depending on your insurance  coverage. However, we may be able to find a substitute medication at lower cost or fill out paperwork to get insurance to cover a needed medication.   If a prior authorization is required to get your medication covered by your insurance company, please allow us  1-2 business days to complete this process.  Drug prices often vary depending on where the prescription is filled and some pharmacies may offer cheaper prices.  The website www.goodrx.com contains coupons for medications through different pharmacies. The prices here do not account for what the cost may be with help from insurance (it may be cheaper with your insurance), but the website can give you the price if you did not use any insurance.  - You can print the associated coupon and take it with your prescription to the pharmacy.  - You may also stop by our office during regular business hours and pick up a GoodRx coupon card.  - If you need your prescription sent electronically to a different pharmacy, notify our office through [facility] or by phone at [phone] option 4.     Si Usted Necesita Algo Despus de Su Visita  Tambin puede enviarnos un mensaje a travs de Clinical Cytogeneticist. Por lo general respondemos a los mensajes de MyChart en el transcurso de 1 a 2 das hbiles.  Para renovar recetas, por favor pida a su farmacia que se ponga en contacto con nuestra oficina. Randi lakes de fax es [place] [phone].  Si tiene un asunto urgente cuando la clnica est cerrada y que no puede esperar hasta el siguiente da hbil, puede llamar/localizar a su doctor(a) al nmero que aparece a continuacin.   Por favor, tenga en cuenta que aunque hacemos todo lo posible para estar disponibles para asuntos urgentes fuera del horario de [place], no estamos disponibles las 24 horas del da, los 7 [address]  [address] de la [place].   Si tiene un problema urgente y no puede comunicarse con nosotros, puede optar por buscar atencin mdica  en el consultorio de  su doctor(a), en una clnica privada, en un centro de atencin urgente o en una sala de emergencias.  Si tiene engineer, drilling, por favor llame inmediatamente al 911 o vaya a la sala de emergencias.  Nmeros de bper  - Dr. Hester: [phone]  - Dra. Jackquline: [phone]  - Dr. Claudene: [phone]  -  Dra. Raymund: [phone]  En caso de inclemencias del tiempo, por favor llame a nuestra lnea principal al [phone] para una actualizacin sobre el [place] de cualquier retraso o cierre.  Consejos para la medicacin en dermatologa: Por favor, guarde las cajas en las que vienen los medicamentos de uso tpico para ayudarle a seguir las instrucciones sobre dnde y cmo usarlos. Las farmacias generalmente imprimen las instrucciones del medicamento slo en las cajas y no directamente en los tubos del [place].   Si su medicamento es muy caro, por favor, pngase en contacto con landry rieger llamando al [phone] y presione la opcin 4 o envenos un mensaje a travs de Clinical Cytogeneticist.   No podemos decirle cul ser su copago por los medicamentos por adelantado ya que esto es diferente dependiendo de la cobertura de su seguro. Sin embargo, es posible que podamos encontrar un medicamento sustituto a audiological scientist un formulario para que el seguro cubra el medicamento que se considera necesario.   Si se requiere una autorizacin previa para que su compaa de seguros cubra su medicamento, por favor permtanos de 1 a 2 das hbiles para completar este proceso.  Los precios de los medicamentos varan con frecuencia dependiendo del environmental consultant de dnde se surte la receta y alguna farmacias pueden ofrecer precios ms baratos.  El sitio web www.goodrx.com tiene cupones para medicamentos de health and safety inspector. Los precios aqu no tienen en cuenta lo que podra costar con la ayuda del seguro (puede ser ms barato con su seguro), pero el sitio web puede darle el precio si no utiliz producer, television/film/video.  - Puede imprimir el cupn correspondiente y llevarlo con su receta a la farmacia.  - Tambin puede pasar por nuestra oficina durante el horario de atencin regular y education officer, museum una tarjeta de cupones de GoodRx.  - Si necesita que su receta se enve electrnicamente a una farmacia diferente, informe a nuestra oficina a travs de MyChart de [place] o por telfono llamando al [phone] y presione la opcin 4.

## 2024-12-16 NOTE — Progress Notes (Signed)
 Follow-Up Visit   Subjective  Megan Nichols is a 68 y.o. female who presents for the following: Skin Cancer Screening and Full Body Skin Exam Hx of aks and isks   The patient presents for Total-Body Skin Exam (TBSE) for skin cancer screening and mole check. The patient has spots, moles and lesions to be evaluated, some may be new or changing and the patient may have concern these could be cancer.  The following portions of the chart were reviewed this encounter and updated as appropriate: medications, allergies, medical history  Review of Systems:  No other skin or systemic complaints except as noted in HPI or Assessment and Plan.  Objective  Well appearing patient in no apparent distress; mood and affect are within normal limits.  A full examination was performed including scalp, head, eyes, ears, nose, lips, neck, chest, axillae, abdomen, back, buttocks, bilateral upper extremities, bilateral lower extremities, hands, feet, fingers, toes, fingernails, and toenails. All findings within normal limits unless otherwise noted below.   Relevant physical exam findings are noted in the Assessment and Plan.  right dorsum wrist x 1, left leg x 1, right leg x 1 (3) Erythematous stuck-on, waxy papule or plaque  Assessment & Plan   SKIN CANCER SCREENING PERFORMED TODAY.  ACTINIC DAMAGE - Chronic condition, secondary to cumulative UV/sun exposure - diffuse scaly erythematous macules with underlying dyspigmentation - Recommend daily broad spectrum sunscreen SPF 30+ to sun-exposed areas, reapply every 2 hours as needed.  - Staying in the shade or wearing long sleeves, sun glasses (UVA+UVB protection) and wide brim hats (4-inch brim around the entire circumference of the hat) are also recommended for sun protection.  - Call for new or changing lesions.  LENTIGINES, SEBORRHEIC KERATOSES, HEMANGIOMAS - Benign normal skin lesions - Benign-appearing - Call for any changes  MELANOCYTIC  NEVI - Tan-brown and/or pink-flesh-colored symmetric macules and papules - Benign appearing on exam today - Observation - Call clinic for new or changing moles - Recommend daily use of broad spectrum spf 30+ sunscreen to sun-exposed areas.   Dermatofibroma. Left leg near knee - Firm pink/brown papulenodule with dimple sign - Benign appearing - Call for any changes  Varicose Veins/Spider Veins - Dilated blue, purple or red veins at the lower extremities - Reassured - Smaller vessels can be treated by sclerotherapy (a procedure to inject a medicine into the veins to make them disappear) if desired, but the treatment is not covered by insurance. Larger vessels may be covered if symptomatic and we would refer to vascular surgeon if treatment desired.  Xerosis - diffuse xerotic patches - recommend gentle, hydrating skin care - gentle skin care handout given  INFLAMED SEBORRHEIC KERATOSIS (3) right dorsum wrist x 1, left leg x 1, right leg x 1 (3) Symptomatic, irritating, patient would like treated. - Destruction of lesion - right dorsum wrist x 1, left leg x 1, right leg x 1 (3) Complexity: simple   Destruction method: cryotherapy   Informed consent: discussed and consent obtained   Timeout:  patient name, date of birth, surgical site, and procedure verified Lesion destroyed using liquid nitrogen: Yes   Region frozen until ice ball extended beyond lesion: Yes   Outcome: patient tolerated procedure well with no complications   Post-procedure details: wound care instructions given    Return in about 1 year (around 12/16/2025) for TBSE.  LILLETTE Eleanor Blush, CMA, am acting as scribe for Alm Rhyme, MD.   Documentation: I have reviewed the above documentation  for accuracy and completeness, and I agree with the above.  Alm Rhyme, MD

## 2025-04-13 ENCOUNTER — Ambulatory Visit

## 2025-12-20 ENCOUNTER — Ambulatory Visit: Admitting: Dermatology
